# Patient Record
Sex: Female | Born: 1995
Health system: Southern US, Community
[De-identification: ages and names within clinical notes are randomized; demographics above are authoritative.]

## PROBLEM LIST (undated history)

## (undated) DIAGNOSIS — Z789 Other specified health status: Secondary | ICD-10-CM

## (undated) HISTORY — DX: Other specified health status: Z78.9

## (undated) HISTORY — PX: NO PAST SURGERIES: SHX2092

---
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
## 2016-03-21 ENCOUNTER — Ambulatory Visit: Payer: 59

## 2016-03-21 ENCOUNTER — Ambulatory Visit (INDEPENDENT_AMBULATORY_CARE_PROVIDER_SITE_OTHER): Payer: 59 | Admitting: *Deleted

## 2016-03-21 ENCOUNTER — Encounter: Payer: Self-pay | Admitting: *Deleted

## 2016-03-21 DIAGNOSIS — Z3042 Encounter for surveillance of injectable contraceptive: Secondary | ICD-10-CM | POA: Diagnosis not present

## 2016-03-21 DIAGNOSIS — Z3202 Encounter for pregnancy test, result negative: Secondary | ICD-10-CM | POA: Diagnosis not present

## 2016-03-21 DIAGNOSIS — Z308 Encounter for other contraceptive management: Secondary | ICD-10-CM

## 2016-03-21 LAB — POCT URINE PREGNANCY: Preg Test, Ur: NEGATIVE

## 2016-03-21 MED ORDER — MEDROXYPROGESTERONE ACETATE 150 MG/ML IM SUSP
150.0000 mg | Freq: Once | INTRAMUSCULAR | Status: AC
  Administered 2016-03-21: 150 mg via INTRAMUSCULAR

## 2016-03-21 NOTE — Progress Notes (Signed)
Pt here for Depo. Pt tolerated shot well. Return in 12 weeks for next shot. JSY 

## 2016-06-13 ENCOUNTER — Ambulatory Visit: Payer: 59

## 2016-06-25 ENCOUNTER — Other Ambulatory Visit: Payer: 59

## 2016-06-25 ENCOUNTER — Ambulatory Visit: Payer: 59

## 2016-06-25 DIAGNOSIS — Z3042 Encounter for surveillance of injectable contraceptive: Secondary | ICD-10-CM | POA: Diagnosis not present

## 2016-06-26 ENCOUNTER — Other Ambulatory Visit: Payer: Self-pay | Admitting: Advanced Practice Midwife

## 2016-06-26 ENCOUNTER — Ambulatory Visit: Payer: 59

## 2016-06-26 LAB — BETA HCG QUANT (REF LAB): hCG Quant: <1 m[IU]/mL

## 2016-08-21 ENCOUNTER — Ambulatory Visit (INDEPENDENT_AMBULATORY_CARE_PROVIDER_SITE_OTHER): Payer: 59 | Admitting: Obstetrics & Gynecology

## 2016-08-21 ENCOUNTER — Encounter: Payer: Self-pay | Admitting: Obstetrics & Gynecology

## 2016-08-21 VITALS — BP 110/60 | HR 87 | Ht 66.0 in | Wt 187.0 lb

## 2016-08-21 DIAGNOSIS — N920 Excessive and frequent menstruation with regular cycle: Secondary | ICD-10-CM | POA: Diagnosis not present

## 2016-08-21 DIAGNOSIS — N939 Abnormal uterine and vaginal bleeding, unspecified: Secondary | ICD-10-CM

## 2016-08-21 LAB — POCT HEMOGLOBIN: Hemoglobin: 14.1 g/dL (ref 12.2–16.2)

## 2016-08-21 NOTE — Progress Notes (Signed)
      Chief Complaint  Patient presents with  . having heavy periods    recently stopped Depo    Blood pressure 110/60, pulse 87, height 5\' 6"  (1.676 m), weight 187 lb (84.8 kg), last menstrual period 08/13/2016.  21 y.o. G0P0000 Patient's last menstrual period was 08/13/2016 (exact date). The current method of family planning is none.  Outpatient Encounter Prescriptions as of 08/21/2016  Medication Sig  . [DISCONTINUED] medroxyPROGESTERone (DEPO-PROVERA) 150 MG/ML injection INJECT 1 ML INTO THE MUSCLE EVERY 3 MONTHS  . [DISCONTINUED] medroxyPROGESTERone (DEPO-PROVERA) injection 150 mg    No facility-administered encounter medications on file as of 08/21/2016.     Subjective Pt is "coming off Depo" had her last shot 03/2016, this is her first cycle Has been on the depo for 5 years Generally  Has a couple of days of spotting on the depo Wants to come off of all hormaonal birth control  She said her periods before the depo were not bad just regular   Objective   Pertinent ROS No burning with urination, frequency or urgency No nausea, vomiting or diarrhea Nor fever chills or other constitutional symptoms    Labs or studies Hemoglobin 14.1    Impression Diagnoses this Encounter::   ICD-9-CM ICD-10-CM   1. Menorrhagia with regular cycle 626.2 N92.0    coming off of Depo provera  2. Vaginal bleeding 623.8 N93.9 POCT hemoglobin    Established relevant diagnosis(es):   Plan/Recommendations: No orders of the defined types were placed in this encounter.   Labs or Scans Ordered: Orders Placed This Encounter  Procedures  . POCT hemoglobin    Management:: Pt does not want to use megestrol at this point Will wait and see what happens  Follow up Return if symptoms worsen or fail to improve.        Face to face time:  15 minutes  Greater than 50% of the visit time was spent in counseling and coordination of care with the patient.  The summary and outline  of the counseling and care coordination is summarized in the note above.   All questions were answered.  History reviewed. No pertinent past medical history.  History reviewed. No pertinent surgical history.  OB History    Gravida Para Term Preterm AB Living   0 0 0 0 0 0   SAB TAB Ectopic Multiple Live Births   0 0 0 0        No Known Allergies  Social History   Social History  . Marital status: Single    Spouse name: N/A  . Number of children: N/A  . Years of education: N/A   Social History Main Topics  . Smoking status: Former Smoker    Types: Cigarettes  . Smokeless tobacco: Never Used  . Alcohol use No  . Drug use: No  . Sexual activity: Yes    Birth control/ protection: None, Condom   Other Topics Concern  . None   Social History Narrative  . None    Family History  Problem Relation Age of Onset  . Diabetes Paternal Grandmother   . Diabetes Maternal Grandmother

## 2016-10-16 ENCOUNTER — Ambulatory Visit: Payer: 59 | Admitting: Adult Health

## 2016-10-23 ENCOUNTER — Ambulatory Visit: Payer: 59 | Admitting: Adult Health

## 2016-10-23 ENCOUNTER — Encounter (INDEPENDENT_AMBULATORY_CARE_PROVIDER_SITE_OTHER): Payer: Self-pay

## 2016-10-23 ENCOUNTER — Ambulatory Visit (INDEPENDENT_AMBULATORY_CARE_PROVIDER_SITE_OTHER): Payer: 59 | Admitting: Adult Health

## 2016-10-23 ENCOUNTER — Encounter: Payer: Self-pay | Admitting: Adult Health

## 2016-10-23 VITALS — BP 110/58 | HR 90 | Ht 66.0 in | Wt 179.0 lb

## 2016-10-23 DIAGNOSIS — Z3A01 Less than 8 weeks gestation of pregnancy: Secondary | ICD-10-CM

## 2016-10-23 DIAGNOSIS — Z3201 Encounter for pregnancy test, result positive: Secondary | ICD-10-CM

## 2016-10-23 DIAGNOSIS — O3680X0 Pregnancy with inconclusive fetal viability, not applicable or unspecified: Secondary | ICD-10-CM

## 2016-10-23 LAB — POCT URINE PREGNANCY: Preg Test, Ur: POSITIVE — AB

## 2016-10-23 MED ORDER — FLINTSTONES COMPLETE 60 MG PO CHEW
1.0000 | CHEWABLE_TABLET | Freq: Every day | ORAL | Status: DC
Start: 2016-10-23 — End: 2017-06-21

## 2016-10-23 NOTE — Progress Notes (Signed)
Subjective:     Patient ID: Wynell BalloonMalia A Ihde, female   DOB: 1995-08-07, 21 y.o.   MRN: [id number]  HPI Laurena SpiesMalia is a 21 year old black female in for UPT, has missed a period and had 3+HPT, some nausea, she says she did spot brown about 2 weeks ago after BM, none since.   Review of Systems Missed period with 3+HPTs +nausea Spotted brown after BM 2 weeks ago Reviewed past medical,surgical, social and family history. Reviewed medications and allergies.     Objective:   Physical Exam BP (!) 110/58 (BP Location: Right Arm, Patient Position: Sitting, Cuff Size: Normal)   Pulse 90   Ht 5\' 6"  (1.676 m)   Wt 179 lb (81.2 kg)   LMP 09/14/2016 (Approximate)   BMI 28.89 kg/m UPT +, about 5+3 weeks by LMP, with EDD 06/22/17,Skin warm and dry. Neck: mid line trachea, normal thyroid, good ROM, no lymphadenopathy noted. Lungs: clear to ausculation bilaterally. Cardiovascular: regular rate and rhythm.Abdomen is soft and non tender.    Pt aware of after hours call service and [facility]  Assessment:     1. Positive pregnancy test   2. Less than [redacted] weeks gestation of pregnancy   3. Encounter to determine fetal viability of pregnancy, single or unspecified fetus       Plan:    Eat often  Ok to take OTC PNV or flintstones Review handout by [facility]  Return in 2 weeks for dating [place]

## 2016-10-24 ENCOUNTER — Ambulatory Visit: Payer: 59 | Admitting: Women's Health

## 2016-10-26 ENCOUNTER — Telehealth: Payer: Self-pay | Admitting: *Deleted

## 2016-10-26 MED ORDER — PROMETHAZINE HCL 25 MG PO TABS: 12.5000 mg | ORAL_TABLET | Freq: Four times a day (QID) | ORAL | 0 refills | Status: DC | PRN

## 2016-10-26 NOTE — Telephone Encounter (Signed)
Called requesting nausea meds, rx phenergan.  Angelica Ramirez, CNM, [facility] 10/26/2016 2:31 PM

## 2016-10-29 ENCOUNTER — Encounter (HOSPITAL_COMMUNITY): Payer: Self-pay

## 2016-10-29 ENCOUNTER — Telehealth: Payer: Self-pay | Admitting: Women's Health

## 2016-10-29 ENCOUNTER — Emergency Department (HOSPITAL_COMMUNITY)
Admission: EM | Admit: 2016-10-29 | Discharge: 2016-10-29 | Disposition: A | Discharge status: Home / Self Care | Payer: 59 | Attending: Emergency Medicine | Admitting: Emergency Medicine

## 2016-10-29 DIAGNOSIS — Z3A01 Less than 8 weeks gestation of pregnancy: Secondary | ICD-10-CM | POA: Insufficient documentation

## 2016-10-29 DIAGNOSIS — O210 Mild hyperemesis gravidarum: Secondary | ICD-10-CM | POA: Diagnosis not present

## 2016-10-29 DIAGNOSIS — O219 Vomiting of pregnancy, unspecified: Secondary | ICD-10-CM | POA: Insufficient documentation

## 2016-10-29 DIAGNOSIS — R112 Nausea with vomiting, unspecified: Secondary | ICD-10-CM

## 2016-10-29 DIAGNOSIS — R110 Nausea: Secondary | ICD-10-CM | POA: Diagnosis not present

## 2016-10-29 LAB — URINALYSIS, ROUTINE W REFLEX MICROSCOPIC
Bilirubin Urine: NEGATIVE
Glucose, UA: NEGATIVE mg/dL
Hgb urine dipstick: NEGATIVE
Ketones, ur: 80 mg/dL — AB
Nitrite: NEGATIVE
Protein, ur: 100 mg/dL — AB
Specific Gravity, Urine: 1.028 (ref 1.005–1.030)
pH: 5 (ref 5.0–8.0)

## 2016-10-29 LAB — COMPREHENSIVE METABOLIC PANEL
ALT: 10 U/L — ABNORMAL LOW (ref 14–54)
AST: 17 U/L (ref 15–41)
Albumin: 4.3 g/dL (ref 3.5–5.0)
Alkaline Phosphatase: 46 U/L (ref 38–126)
Anion gap: 9 (ref 5–15)
BUN: 7 mg/dL (ref 6–20)
CO2: 25 mmol/L (ref 22–32)
Calcium: 9.5 mg/dL (ref 8.9–10.3)
Chloride: 103 mmol/L (ref 101–111)
Creatinine, Ser: 0.66 mg/dL (ref 0.44–1.00)
GFR calc Af Amer: >60 mL/min (ref >60)
GFR calc non Af Amer: >60 mL/min (ref >60)
Glucose, Bld: 92 mg/dL (ref 65–99)
Potassium: 3.6 mmol/L (ref 3.5–5.1)
Sodium: 137 mmol/L (ref 135–145)
Total Bilirubin: 1.2 mg/dL (ref 0.3–1.2)
Total Protein: 7.8 g/dL (ref 6.5–8.1)

## 2016-10-29 LAB — CBC
HCT: 40.3 % (ref 36.0–46.0)
Hemoglobin: 13.6 g/dL (ref 12.0–15.0)
MCH: 28.4 pg (ref 26.0–34.0)
MCHC: 33.7 g/dL (ref 30.0–36.0)
MCV: 84.1 fL (ref 78.0–100.0)
Platelets: 250 10*3/uL (ref 150–400)
RBC: 4.79 MIL/uL (ref 3.87–5.11)
RDW: 11.8 % (ref 11.5–15.5)
WBC: 12.8 10*3/uL — ABNORMAL HIGH (ref 4.0–10.5)

## 2016-10-29 LAB — LIPASE, BLOOD: Lipase: 28 U/L (ref 11–51)

## 2016-10-29 MED ORDER — ONDANSETRON 4 MG PO TBDP: 4.0000 mg | ORAL_TABLET | Freq: Three times a day (TID) | ORAL | 1 refills | Status: DC | PRN

## 2016-10-29 MED ORDER — ONDANSETRON 4 MG PO TBDP
4.0000 mg | ORAL_TABLET | Freq: Once | ORAL | Status: AC
  Administered 2016-10-29: 4 mg via ORAL

## 2016-10-29 MED ORDER — PROMETHAZINE HCL 25 MG PO TABS: 25.0000 mg | ORAL_TABLET | Freq: Four times a day (QID) | ORAL | 1 refills | Status: DC | PRN

## 2016-10-29 MED ORDER — ONDANSETRON 4 MG PO TBDP
ORAL_TABLET | ORAL | Status: AC
  Filled 2016-10-29: qty 1

## 2016-10-29 MED ORDER — SODIUM CHLORIDE 0.9 % IV BOLUS (SEPSIS): 2000.0000 mL | Freq: Once | INTRAVENOUS | Status: DC

## 2016-10-29 MED ORDER — SODIUM CHLORIDE 0.9 % IV SOLN: INTRAVENOUS | Status: DC

## 2016-10-29 MED ORDER — ONDANSETRON HCL 4 MG/2ML IJ SOLN
4.0000 mg | Freq: Once | INTRAMUSCULAR | Status: DC
  Filled 2016-10-29: qty 2

## 2016-10-29 NOTE — ED Notes (Signed)
MD at the bedside  

## 2016-10-29 NOTE — ED Notes (Signed)
Patient given discharge instruction, verbalized understand. Patient ambulatory out of the department.  Signature pad did not work

## 2016-10-29 NOTE — ED Notes (Signed)
Pt drinking water with out nausea, MD aware

## 2016-10-29 NOTE — ED Notes (Signed)
4 attempts by two RN for IV. Pt does not want another stick, Ok to try ODT and fluids, MD aware and gave verbal order.

## 2016-10-29 NOTE — ED Provider Notes (Signed)
[facility] DEPT Provider Note   CSN: [id number] Arrival date & time: 10/29/16  1203     History   Chief Complaint Chief Complaint  Patient presents with  . Emesis    HPI Angelica Ramirez is a 21 y.o. female.  Patient is gravida 1 para 0 approximate [redacted] weeks pregnant. [facility] by [facility]. Patient's been struggling with nausea and vomiting about 3-4 times a day. No fevers no abdominal pain no pelvic pain no vaginal bleeding.      History reviewed. No pertinent past medical history.  Patient Active Problem List   Diagnosis Date Noted  . Encounter for initial prescription of injectable contraceptive 07/06/2015  . IMMUNIZATION DELAY 01/05/2008  . INFECTIOUS MONONUCLEOSIS 12/08/2007    History reviewed. No pertinent surgical history.  OB History    Gravida Para Term Preterm AB Living   1 0 0 0 0 0   SAB TAB Ectopic Multiple Live Births   0 0 0 0         Home Medications    Prior to Admission medications   Medication Sig Start Date End Date Taking? Authorizing Provider  flintstones complete (FLINTSTONES) 60 MG chewable tablet Chew 1 tablet by mouth daily. 10/23/16  Yes Cyril Mourning A, NP  ondansetron (ZOFRAN ODT) 4 MG disintegrating tablet Take 1 tablet (4 mg total) by mouth every 8 (eight) hours as needed. 10/29/16   Vanetta Mulders, MD  promethazine (PHENERGAN) 25 MG tablet Take 0.5-1 tablets (12.5-25 mg total) by mouth every 6 (six) hours as needed for nausea or vomiting. 10/26/16   Cheral Marker, CNM  promethazine (PHENERGAN) 25 MG tablet Take 1 tablet (25 mg total) by mouth every 6 (six) hours as needed. 10/29/16   Vanetta Mulders, MD    Family History Family History  Problem Relation Age of Onset  . Diabetes Paternal Grandmother   . Diabetes Maternal Grandmother     Social History Social History  Substance Use Topics  . Smoking status: Never Smoker  . Smokeless tobacco: Never Used  . Alcohol use No     Allergies   Patient has  no known allergies.   Review of Systems Review of Systems  Constitutional: Positive for appetite change. Negative for fever.  HENT: Negative for congestion.   Respiratory: Negative for shortness of breath.   Cardiovascular: Negative for chest pain.  Gastrointestinal: Positive for nausea and vomiting. Negative for abdominal pain.  Genitourinary: Negative for pelvic pain and vaginal bleeding.  Musculoskeletal: Negative for back pain.  Skin: Negative for rash.  Neurological: Negative for headaches.  Hematological: Does not bruise/bleed easily.  Psychiatric/Behavioral: Negative for confusion.     Physical Exam Updated Vital Signs BP 126/67   Pulse 60   Temp 98.6 F (37 C) (Oral)   Resp 17   Ht 1.676 m (5\' 6" )   Wt 81.2 kg (179 lb)   LMP 09/14/2016 (Approximate)   SpO2 100%   BMI 28.89 kg/m   Physical Exam  Constitutional: She is oriented to person, place, and time. She appears well-developed and well-nourished. No distress.  HENT:  Head: Normocephalic and atraumatic.  Mouth/Throat: Oropharynx is clear and moist.  Eyes: Pupils are equal, round, and reactive to light. Conjunctivae and EOM are normal.  Neck: Normal range of motion.  Cardiovascular: Normal rate, regular rhythm and normal heart sounds.   Pulmonary/Chest: Effort normal and breath sounds normal.  Abdominal: Soft. Bowel sounds are normal. There is no tenderness.  Musculoskeletal: Normal range of motion.  She exhibits no edema.  Neurological: She is alert and oriented to person, place, and time. No cranial nerve deficit or sensory deficit. She exhibits normal muscle tone. Coordination normal.  Skin: Skin is warm.  Nursing note and vitals reviewed.    ED Treatments / Results  Labs (all labs ordered are listed, but only abnormal results are displayed) Labs Reviewed  COMPREHENSIVE METABOLIC PANEL - Abnormal; Notable for the following:       Result Value   ALT 10 (*)    All other components within normal limits   CBC - Abnormal; Notable for the following:    WBC 12.8 (*)    All other components within normal limits  URINALYSIS, ROUTINE W REFLEX MICROSCOPIC - Abnormal; Notable for the following:    APPearance HAZY (*)    Ketones, ur 80 (*)    Protein, ur 100 (*)    Leukocytes, UA TRACE (*)    Bacteria, UA RARE (*)    Squamous Epithelial / LPF 6-30 (*)    All other components within normal limits  LIPASE, BLOOD    EKG  EKG Interpretation None       Radiology No results found.  Procedures Procedures (including critical care time)  Medications Ordered in ED Medications  ondansetron (ZOFRAN-ODT) 4 MG disintegrating tablet (not administered)  ondansetron (ZOFRAN-ODT) disintegrating tablet 4 mg (4 mg Oral Given 10/29/16 1633)     Initial Impression / Assessment and Plan / ED Course  I have reviewed the triage vital signs and the nursing notes.  Pertinent labs & imaging results that were available during my care of the patient were reviewed by me and considered in my medical decision making (see chart for details).     Patient is about [redacted] weeks pregnant. Followed by [facility]. Patient with difficulty with nausea and vomiting vomiting about 3-4 times a day. Patient did have some prescription for Phenergan phoned in by [facility] patient unaware of this. Here patient the felt much better with Zofran. Had initially intended IV but patient didn't want IV. Bit of a difficult stick. But patient was well-hydrated. Patient was given prescriptions for Phenergan and Zofran. [facility] with [facility].    Final Clinical Impressions(s) / ED Diagnoses   Final diagnoses:  Non-intractable vomiting with nausea, unspecified vomiting type  Less than [redacted] weeks gestation of pregnancy    New Prescriptions New Prescriptions   ONDANSETRON (ZOFRAN ODT) 4 MG DISINTEGRATING TABLET    Take 1 tablet (4 mg total) by mouth every 8 (eight) hours as needed.   PROMETHAZINE (PHENERGAN) 25 MG TABLET     Take 1 tablet (25 mg total) by mouth every 6 (six) hours as needed.     Vanetta MuldersZackowski, Ariane Ditullio, MD 10/29/16 [phone]

## 2016-10-29 NOTE — Discharge Instructions (Signed)
Follow-up with [facility]. Take the Zofran or the Phenergan on a regular basis with nausea and vomiting. Also can take both.

## 2016-10-29 NOTE — Telephone Encounter (Signed)
LMOVM returning call 

## 2016-10-29 NOTE — ED Triage Notes (Signed)
Reports of nausea and vomiting x1 week. Currently [redacted] weeks pregnant. Denies pain.

## 2016-10-30 ENCOUNTER — Telehealth: Payer: Self-pay | Admitting: *Deleted

## 2016-10-30 NOTE — Telephone Encounter (Signed)
Patient states she went to ER because the nausea was so bad and was prescribed something there. Encouraged patient to mainly stay hydrated, small sips of fluid, try lemon heads and saltines. Verbalized understanding.
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
## 2016-11-06 ENCOUNTER — Ambulatory Visit (INDEPENDENT_AMBULATORY_CARE_PROVIDER_SITE_OTHER): Payer: 59

## 2016-11-06 ENCOUNTER — Other Ambulatory Visit: Payer: 59

## 2016-11-06 DIAGNOSIS — O3680X0 Pregnancy with inconclusive fetal viability, not applicable or unspecified: Secondary | ICD-10-CM

## 2016-11-06 NOTE — Progress Notes (Signed)
US 7+4 wks,single IUP w/ys,positive fht 129 bpm,normal right ovary,simple left corpus luteal cyst left ovary 2.9 x 2.5 x 1.7 cm,crl 10.4 mm,EDD 06/21/2017

## 2016-11-22 ENCOUNTER — Ambulatory Visit (INDEPENDENT_AMBULATORY_CARE_PROVIDER_SITE_OTHER): Payer: 59 | Admitting: Advanced Practice Midwife

## 2016-11-22 ENCOUNTER — Encounter: Payer: Self-pay | Admitting: Advanced Practice Midwife

## 2016-11-22 VITALS — BP 122/60 | HR 102 | Wt 172.0 lb

## 2016-11-22 DIAGNOSIS — Z3A09 9 weeks gestation of pregnancy: Secondary | ICD-10-CM | POA: Diagnosis not present

## 2016-11-22 DIAGNOSIS — Z3401 Encounter for supervision of normal first pregnancy, first trimester: Secondary | ICD-10-CM | POA: Diagnosis not present

## 2016-11-22 DIAGNOSIS — Z1389 Encounter for screening for other disorder: Secondary | ICD-10-CM

## 2016-11-22 DIAGNOSIS — Z3682 Encounter for antenatal screening for nuchal translucency: Secondary | ICD-10-CM

## 2016-11-22 DIAGNOSIS — Z3400 Encounter for supervision of normal first pregnancy, unspecified trimester: Secondary | ICD-10-CM | POA: Insufficient documentation

## 2016-11-22 DIAGNOSIS — Z331 Pregnant state, incidental: Secondary | ICD-10-CM

## 2016-11-22 LAB — POCT URINALYSIS DIPSTICK
Blood, UA: NEGATIVE
Glucose, UA: NEGATIVE
Ketones, UA: NEGATIVE
Leukocytes, UA: NEGATIVE
Nitrite, UA: NEGATIVE
Protein, UA: NEGATIVE

## 2016-11-22 NOTE — Progress Notes (Signed)
  Subjective:    Angelica Ramirez is a G1P0000 5163w6d being seen today for her first obstetrical visit.  Her obstetrical history is significant for first pregnancy.  Pregnancy history fully reviewed.  Patient reports no complaints.  Vitals:   11/22/16 1009  BP: 122/60  Pulse: (!) 102  Weight: 172 lb (78 kg)    HISTORY: OB History  Gravida Para Term Preterm AB Living  1 0 0 0 0 0  SAB TAB Ectopic Multiple Live Births  0 0 0 0      # Outcome Date GA Lbr Len/2nd Weight Sex Delivery Anes PTL Lv  1 Current              Past Medical History:  Diagnosis Date  . Medical history non-contributory    Past Surgical History:  Procedure Laterality Date  . NO PAST SURGERIES     Family History  Problem Relation Age of Onset  . Diabetes Paternal Grandmother   . Diabetes Maternal Grandmother      Exam                                      System:     Skin: normal coloration and turgor, no rashes    Neurologic: oriented, normal, normal mood   Extremities: normal strength, tone, and muscle mass   HEENT PERRLA   Mouth/Teeth mucous membranes moist, normal dentition   Neck supple and no masses   Cardiovascular: regular rate and rhythm   Respiratory:  appears well, vitals normal, no respiratory distress, acyanotic   Abdomen: soft, non-tender;  FHR: 160us          Assessment:    Pregnancy: G1P0000 Patient Active Problem List   Diagnosis Date Noted  . Supervision of normal first pregnancy 11/22/2016  . IMMUNIZATION DELAY 01/05/2008  . INFECTIOUS MONONUCLEOSIS 12/08/2007        Plan:     Initial labs drawn. Continue prenatal vitamins  Problem list reviewed and updated  Reviewed n/v relief measures and warning s/s to report  Reviewed recommended weight gain based on pre-gravid BMI  Encouraged well-balanced diet Genetic Screening discussed Integrated Screen: requested.  Ultrasound discussed; fetal survey: requested.  Return in about 2 weeks (around 12/06/2016)  for US:NT+1st IT, LROB.  CRESENZO-DISHMAN,Areliz Rothman 11/22/2016

## 2016-11-22 NOTE — Patient Instructions (Signed)
 First Trimester of Pregnancy The first trimester of pregnancy is from week 1 until the end of week 12 (months 1 through 3). A week after a sperm fertilizes an egg, the egg will implant on the wall of the uterus. This embryo will begin to develop into a baby. Genes from you and your partner are forming the baby. The female genes determine whether the baby is a boy or a girl. At 6-8 weeks, the eyes and face are formed, and the heartbeat can be seen on ultrasound. At the end of 12 weeks, all the baby's organs are formed.  Now that you are pregnant, you will want to do everything you can to have a healthy baby. Two of the most important things are to get good prenatal care and to follow your health care provider's instructions. Prenatal care is all the medical care you receive before the baby's birth. This care will help prevent, find, and treat any problems during the pregnancy and childbirth. BODY CHANGES Your body goes through many changes during pregnancy. The changes vary from woman to woman.   You may gain or lose a couple of pounds at first.  You may feel sick to your stomach (nauseous) and throw up (vomit). If the vomiting is uncontrollable, call your health care provider.  You may tire easily.  You may develop headaches that can be relieved by medicines approved by your health care provider.  You may urinate more often. Painful urination may mean you have a bladder infection.  You may develop heartburn as a result of your pregnancy.  You may develop constipation because certain hormones are causing the muscles that push waste through your intestines to slow down.  You may develop hemorrhoids or swollen, bulging veins (varicose veins).  Your breasts may begin to grow larger and become tender. Your nipples may stick out more, and the tissue that surrounds them (areola) may become darker.  Your gums may bleed and may be sensitive to brushing and flossing.  Dark spots or blotches  (chloasma, mask of pregnancy) may develop on your face. This will likely fade after the baby is born.  Your menstrual periods will stop.  You may have a loss of appetite.  You may develop cravings for certain kinds of food.  You may have changes in your emotions from day to day, such as being excited to be pregnant or being concerned that something may go wrong with the pregnancy and baby.  You may have more vivid and strange dreams.  You may have changes in your hair. These can include thickening of your hair, rapid growth, and changes in texture. Some women also have hair loss during or after pregnancy, or hair that feels dry or thin. Your hair will most likely return to normal after your baby is born. WHAT TO EXPECT AT YOUR PRENATAL VISITS During a routine prenatal visit:  You will be weighed to make sure you and the baby are growing normally.  Your blood pressure will be taken.  Your abdomen will be measured to track your baby's growth.  The fetal heartbeat will be listened to starting around week 10 or 12 of your pregnancy.  Test results from any previous visits will be discussed. Your health care provider may ask you:  How you are feeling.  If you are feeling the baby move.  If you have had any abnormal symptoms, such as leaking fluid, bleeding, severe headaches, or abdominal cramping.  If you have any questions. Other   tests that may be performed during your first trimester include:  Blood tests to find your blood type and to check for the presence of any previous infections. They will also be used to check for low iron levels (anemia) and Rh antibodies. Later in the pregnancy, blood tests for diabetes will be done along with other tests if problems develop.  Urine tests to check for infections, diabetes, or protein in the urine.  An ultrasound to confirm the proper growth and development of the baby.  An amniocentesis to check for possible genetic problems.  Fetal  screens for spina bifida and Down syndrome.  You may need other tests to make sure you and the baby are doing well. HOME CARE INSTRUCTIONS  Medicines  Follow your health care provider's instructions regarding medicine use. Specific medicines may be either safe or unsafe to take during pregnancy.  Take your prenatal vitamins as directed.  If you develop constipation, try taking a stool softener if your health care provider approves. Diet  Eat regular, well-balanced meals. Choose a variety of foods, such as meat or vegetable-based protein, fish, milk and low-fat dairy products, vegetables, fruits, and whole grain breads and cereals. Your health care provider will help you determine the amount of weight gain that is right for you.  Avoid raw meat and uncooked cheese. These carry germs that can cause birth defects in the baby.  Eating four or five small meals rather than three large meals a day may help relieve nausea and vomiting. If you start to feel nauseous, eating a few soda crackers can be helpful. Drinking liquids between meals instead of during meals also seems to help nausea and vomiting.  If you develop constipation, eat more high-fiber foods, such as fresh vegetables or fruit and whole grains. Drink enough fluids to keep your urine clear or pale yellow. Activity and Exercise  Exercise only as directed by your health care provider. Exercising will help you:  Control your weight.  Stay in shape.  Be prepared for labor and delivery.  Experiencing pain or cramping in the lower abdomen or low back is a good sign that you should stop exercising. Check with your health care provider before continuing normal exercises.  Try to avoid standing for long periods of time. Move your legs often if you must stand in one place for a long time.  Avoid heavy lifting.  Wear low-heeled shoes, and practice good posture.  You may continue to have sex unless your health care provider directs you  otherwise. Relief of Pain or Discomfort  Wear a good support bra for breast tenderness.   Take warm sitz baths to soothe any pain or discomfort caused by hemorrhoids. Use hemorrhoid cream if your health care provider approves.   Rest with your legs elevated if you have leg cramps or low back pain.  If you develop varicose veins in your legs, wear support hose. Elevate your feet for 15 minutes, 3-4 times a day. Limit salt in your diet. Prenatal Care  Schedule your prenatal visits by the twelfth week of pregnancy. They are usually scheduled monthly at first, then more often in the last 2 months before delivery.  Write down your questions. Take them to your prenatal visits.  Keep all your prenatal visits as directed by your health care provider. Safety  Wear your seat belt at all times when driving.  Make a list of emergency phone numbers, including numbers for family, friends, the hospital, and police and fire departments. General   Tips  Ask your health care provider for a referral to a local prenatal education class. Begin classes no later than at the beginning of month 6 of your pregnancy.  Ask for help if you have counseling or nutritional needs during pregnancy. Your health care provider can offer advice or refer you to specialists for help with various needs.  Do not use hot tubs, steam rooms, or saunas.  Do not douche or use tampons or scented sanitary pads.  Do not cross your legs for long periods of time.  Avoid cat litter boxes and soil used by cats. These carry germs that can cause birth defects in the baby and possibly loss of the fetus by miscarriage or stillbirth.  Avoid all smoking, herbs, alcohol, and medicines not prescribed by your health care provider. Chemicals in these affect the formation and growth of the baby.  Schedule a dentist appointment. At home, brush your teeth with a soft toothbrush and be gentle when you floss. SEEK MEDICAL CARE IF:   You have  dizziness.  You have mild pelvic cramps, pelvic pressure, or nagging pain in the abdominal area.  You have persistent nausea, vomiting, or diarrhea.  You have a bad smelling vaginal discharge.  You have pain with urination.  You notice increased swelling in your face, hands, legs, or ankles. SEEK IMMEDIATE MEDICAL CARE IF:   You have a fever.  You are leaking fluid from your vagina.  You have spotting or bleeding from your vagina.  You have severe abdominal cramping or pain.  You have rapid weight gain or loss.  You vomit blood or material that looks like coffee grounds.  You are exposed to German measles and have never had them.  You are exposed to fifth disease or chickenpox.  You develop a severe headache.  You have shortness of breath.  You have any kind of trauma, such as from a fall or a car accident. Document Released: 03/27/2001 Document Revised: 08/17/2013 Document Reviewed: 02/10/2013 [employer] Patient Information 2015 [employer]. This information is not intended to replace advice given to you by your health care provider. Make sure you discuss any questions you have with your health care provider.   Nausea & Vomiting  Have saltine crackers or pretzels by your bed and eat a few bites before you raise your head out of bed in the morning  Eat small frequent meals throughout the day instead of large meals  Drink plenty of fluids throughout the day to stay hydrated, just don't drink a lot of fluids with your meals.  This can make your stomach fill up faster making you feel sick  Do not brush your teeth right after you eat  Products with real ginger are good for nausea, like ginger ale and ginger hard candy Make sure it says made with real ginger!  Sucking on sour candy like lemon heads is also good for nausea  If your prenatal vitamins make you nauseated, take them at night so you will sleep through the nausea  Sea Bands  If you feel like you need  medicine for the nausea & vomiting please let us know  If you are unable to keep any fluids or food down please let us know   Constipation  Drink plenty of fluid, preferably water, throughout the day  Eat foods high in fiber such as fruits, vegetables, and grains  Exercise, such as walking, is a good way to keep your bowels regular  Drink warm fluids, especially warm   prune juice, or decaf coffee  Eat a 1/2 cup of real oatmeal (not instant), 1/2 cup applesauce, and 1/2-1 cup warm prune juice every day  If needed, you may take Colace (docusate sodium) stool softener once or twice a day to help keep the stool soft. If you are pregnant, wait until you are out of your first trimester (12-14 weeks of pregnancy)  If you still are having problems with constipation, you may take Miralax once daily as needed to help keep your bowels regular.  If you are pregnant, wait until you are out of your first trimester (12-14 weeks of pregnancy)  Safe Medications in Pregnancy   Acne: Benzoyl Peroxide Salicylic Acid  Backache/Headache: Tylenol: 2 regular strength every 4 hours OR              2 Extra strength every 6 hours  Colds/Coughs/Allergies: Benadryl (alcohol free) 25 mg every 6 hours as needed Breath right strips Claritin Cepacol throat lozenges Chloraseptic throat spray Cold-Eeze- up to three times per day Cough drops, alcohol free Flonase (by prescription only) Guaifenesin Mucinex Robitussin DM (plain only, alcohol free) Saline nasal spray/drops Sudafed (pseudoephedrine) & Actifed ** use only after [redacted] weeks gestation and if you do not have high blood pressure Tylenol Vicks Vaporub Zinc lozenges Zyrtec   Constipation: Colace Ducolax suppositories Fleet enema Glycerin suppositories Metamucil Milk of magnesia Miralax Senokot Smooth move tea  Diarrhea: Kaopectate Imodium A-D  *NO pepto Bismol  Hemorrhoids: Anusol Anusol HC Preparation  H Tucks  Indigestion: Tums Maalox Mylanta Zantac  Pepcid  Insomnia: Benadryl (alcohol free) 25mg every 6 hours as needed Tylenol PM Unisom, no Gelcaps  Leg Cramps: Tums MagGel  Nausea/Vomiting:  Bonine Dramamine Emetrol Ginger extract Sea bands Meclizine  Nausea medication to take during pregnancy:  Unisom (doxylamine succinate 25 mg tablets) Take one tablet daily at bedtime. If symptoms are not adequately controlled, the dose can be increased to a maximum recommended dose of two tablets daily (1/2 tablet in the morning, 1/2 tablet mid-afternoon and one at bedtime). Vitamin B6 100mg tablets. Take one tablet twice a day (up to 200 mg per day).  Skin Rashes: Aveeno products Benadryl cream or 25mg every 6 hours as needed Calamine Lotion 1% cortisone cream  Yeast infection: Gyne-lotrimin 7 Monistat 7   **If taking multiple medications, please check labels to avoid duplicating the same active ingredients **take medication as directed on the label ** Do not exceed 4000 mg of tylenol in 24 hours **Do not take medications that contain aspirin or ibuprofen      

## 2016-11-23 LAB — ABO/RH: Rh Factor: POSITIVE

## 2016-11-23 LAB — CBC
Hematocrit: 38.7 % (ref 34.0–46.6)
Hemoglobin: 12.5 g/dL (ref 11.1–15.9)
MCH: 27.7 pg (ref 26.6–33.0)
MCHC: 32.3 g/dL (ref 31.5–35.7)
MCV: 86 fL (ref 79–97)
Platelets: 229 10*3/uL (ref 150–379)
RBC: 4.51 x10E6/uL (ref 3.77–5.28)
RDW: 13.4 % (ref 12.3–15.4)
WBC: 9.6 10*3/uL (ref 3.4–10.8)

## 2016-11-23 LAB — MICROSCOPIC EXAMINATION: Casts: NONE SEEN /lpf

## 2016-11-23 LAB — URINALYSIS, ROUTINE W REFLEX MICROSCOPIC
Bilirubin, UA: NEGATIVE
Ketones, UA: NEGATIVE
Nitrite, UA: NEGATIVE
RBC, UA: NEGATIVE
Specific Gravity, UA: 1.027 (ref 1.005–1.030)
Urobilinogen, Ur: 1 mg/dL (ref 0.2–1.0)
pH, UA: 6.5 (ref 5.0–7.5)

## 2016-11-23 LAB — ANTIBODY SCREEN: Antibody Screen: NEGATIVE

## 2016-11-23 LAB — RPR: RPR Ser Ql: NONREACTIVE

## 2016-11-23 LAB — HIV ANTIBODY (ROUTINE TESTING W REFLEX): HIV Screen 4th Generation wRfx: NONREACTIVE

## 2016-11-23 LAB — RUBELLA SCREEN: Rubella Antibodies, IGG: 1.17 index (ref >0.99)

## 2016-11-23 LAB — HEPATITIS B SURFACE ANTIGEN: Hepatitis B Surface Ag: NEGATIVE

## 2016-11-23 LAB — VARICELLA ZOSTER ANTIBODY, IGG: Varicella zoster IgG: 265 index (ref >165)

## 2016-11-23 LAB — SICKLE CELL SCREEN: Sickle Cell Screen: NEGATIVE

## 2016-11-24 LAB — URINE CULTURE: Organism ID, Bacteria: NO GROWTH

## 2016-11-27 LAB — GC/CHLAMYDIA PROBE AMP
Chlamydia trachomatis, NAA: NEGATIVE
Neisseria gonorrhoeae by PCR: NEGATIVE

## 2016-12-06 ENCOUNTER — Ambulatory Visit (INDEPENDENT_AMBULATORY_CARE_PROVIDER_SITE_OTHER): Payer: 59

## 2016-12-06 ENCOUNTER — Ambulatory Visit (INDEPENDENT_AMBULATORY_CARE_PROVIDER_SITE_OTHER): Payer: 59 | Admitting: Women's Health

## 2016-12-06 ENCOUNTER — Encounter: Payer: Self-pay | Admitting: Women's Health

## 2016-12-06 VITALS — BP 104/68 | HR 88 | Wt 172.0 lb

## 2016-12-06 DIAGNOSIS — Z3682 Encounter for antenatal screening for nuchal translucency: Secondary | ICD-10-CM | POA: Diagnosis not present

## 2016-12-06 DIAGNOSIS — Z3401 Encounter for supervision of normal first pregnancy, first trimester: Secondary | ICD-10-CM | POA: Diagnosis not present

## 2016-12-06 DIAGNOSIS — Z3A11 11 weeks gestation of pregnancy: Secondary | ICD-10-CM

## 2016-12-06 DIAGNOSIS — Z331 Pregnant state, incidental: Secondary | ICD-10-CM

## 2016-12-06 DIAGNOSIS — Z1389 Encounter for screening for other disorder: Secondary | ICD-10-CM

## 2016-12-06 LAB — POCT URINALYSIS DIPSTICK
Blood, UA: NEGATIVE
Ketones, UA: NEGATIVE
Leukocytes, UA: NEGATIVE
Nitrite, UA: NEGATIVE
Protein, UA: NEGATIVE

## 2016-12-06 NOTE — Progress Notes (Signed)
Low-risk OB appointment G1P0000 [redacted]w[redacted]d Estimated Date of Delivery: 06/21/17 BP 104/68   Pulse 88   Wt 172 lb (78 kg)   LMP 09/14/2016 (Approximate)   BMI 27.76 kg/m   BP, weight, and urine reviewed.  Refer to obstetrical flow sheet for FH & FHR.  No fm yet. Denies cramping, lof, vb, or uti s/s. No complaints. Reviewed today's normal nt u/s, warning s/s to report. Plan:  Continue routine obstetrical care  F/U in 4wks for OB appointment and 2nd IT  UDS today, wasn't resulted at new ob

## 2016-12-06 NOTE — Patient Instructions (Signed)

## 2016-12-06 NOTE — Progress Notes (Signed)
[place] 11+6 wks,measurements c/w dates,normal right ovary,simple left corpus luteal cyst 2.8 x 3.3 x 1.7 cm,fhr 157 bpm,post pl gr 0,NB present,NT 1 mm

## 2016-12-07 ENCOUNTER — Encounter: Payer: Self-pay | Admitting: Women's Health

## 2016-12-07 DIAGNOSIS — F1290 Cannabis use, unspecified, uncomplicated: Secondary | ICD-10-CM | POA: Insufficient documentation

## 2016-12-07 LAB — PMP SCREEN PROFILE (10S), URINE
Amphetamine Scrn, Ur: NEGATIVE ng/mL
BARBITURATE SCREEN URINE: NEGATIVE ng/mL
BENZODIAZEPINE SCREEN, URINE: NEGATIVE ng/mL
CANNABINOIDS UR QL SCN: POSITIVE ng/mL — AB
Cocaine (Metab) Scrn, Ur: NEGATIVE ng/mL
Creatinine(Crt), U: 125.2 mg/dL (ref 20.0–300.0)
Methadone Screen, Urine: NEGATIVE ng/mL
OXYCODONE+OXYMORPHONE UR QL SCN: NEGATIVE ng/mL
Opiate Scrn, Ur: NEGATIVE ng/mL
Ph of Urine: 7.5 (ref 4.5–8.9)
Phencyclidine Qn, Ur: NEGATIVE ng/mL
Propoxyphene Scrn, Ur: NEGATIVE ng/mL

## 2016-12-07 LAB — MED LIST OPTION NOT SELECTED

## 2016-12-12 LAB — MATERNAL SCREEN, INTEGRATED #1
Crown Rump Length: 50.8 mm
Gest. Age on Collection Date: 11.9 weeks
Maternal Age at EDD: 20.5 yr
Nuchal Translucency (NT): 1 mm
Number of Fetuses: 1
PAPP-A Value: 1526.1 ng/mL
Weight: 172 [lb_av]

## 2016-12-25 ENCOUNTER — Telehealth: Payer: Self-pay | Admitting: *Deleted

## 2016-12-25 NOTE — Telephone Encounter (Signed)
Can be seen for a non urgent visit since not cramping.

## 2016-12-25 NOTE — Telephone Encounter (Signed)
Informed patient that since she is not cramping she could be seen for non urgent visit. Patient is already scheduled for visit tomorrow. Still no cramping currently.

## 2016-12-25 NOTE — Telephone Encounter (Signed)
Patient called with complaints of light brown blood last night and light pink this morning mainly when she wipes after using the bathroom.  States she is having to wear a panty liner but is not saturating it. No cramping and no recent intercourse.

## 2016-12-26 ENCOUNTER — Ambulatory Visit (INDEPENDENT_AMBULATORY_CARE_PROVIDER_SITE_OTHER): Payer: 59 | Admitting: Adult Health

## 2016-12-26 ENCOUNTER — Encounter: Payer: Self-pay | Admitting: Adult Health

## 2016-12-26 VITALS — BP 118/60 | HR 91 | Wt 175.0 lb

## 2016-12-26 DIAGNOSIS — N760 Acute vaginitis: Secondary | ICD-10-CM | POA: Diagnosis not present

## 2016-12-26 DIAGNOSIS — N898 Other specified noninflammatory disorders of vagina: Secondary | ICD-10-CM | POA: Diagnosis not present

## 2016-12-26 DIAGNOSIS — O26892 Other specified pregnancy related conditions, second trimester: Secondary | ICD-10-CM

## 2016-12-26 DIAGNOSIS — Z1389 Encounter for screening for other disorder: Secondary | ICD-10-CM

## 2016-12-26 DIAGNOSIS — Z331 Pregnant state, incidental: Secondary | ICD-10-CM

## 2016-12-26 DIAGNOSIS — Z3A14 14 weeks gestation of pregnancy: Secondary | ICD-10-CM

## 2016-12-26 DIAGNOSIS — B9689 Other specified bacterial agents as the cause of diseases classified elsewhere: Secondary | ICD-10-CM

## 2016-12-26 LAB — POCT URINALYSIS DIPSTICK
Blood, UA: NEGATIVE
Glucose, UA: NEGATIVE
Ketones, UA: NEGATIVE
Leukocytes, UA: NEGATIVE
Nitrite, UA: NEGATIVE
Protein, UA: NEGATIVE

## 2016-12-26 LAB — POCT WET PREP (WET MOUNT)
Clue Cells Wet Prep Whiff POC: POSITIVE
WBC, Wet Prep HPF POC: POSITIVE

## 2016-12-26 MED ORDER — METRONIDAZOLE 500 MG PO TABS: 500.0000 mg | ORAL_TABLET | Freq: Two times a day (BID) | ORAL | 0 refills | Status: DC

## 2016-12-26 NOTE — Patient Instructions (Signed)
Bacterial Vaginosis Bacterial vaginosis is a vaginal infection that occurs when the normal balance of bacteria in the vagina is disrupted. It results from an overgrowth of certain bacteria. This is the most common vaginal infection among women ages 15-44. Because bacterial vaginosis increases your risk for STIs (sexually transmitted infections), getting treated can help reduce your risk for chlamydia, gonorrhea, herpes, and HIV (human immunodeficiency virus). Treatment is also important for preventing complications in pregnant women, because this condition can cause an early (premature) delivery. What are the causes? This condition is caused by an increase in harmful bacteria that are normally present in small amounts in the vagina. However, the reason that the condition develops is not fully understood. What increases the risk? The following factors may make you more likely to develop this condition:  Having a new sexual partner or multiple sexual partners.  Having unprotected sex.  Douching.  Having an intrauterine device (IUD).  Smoking.  Drug and alcohol abuse.  Taking certain antibiotic medicines.  Being pregnant.  You cannot get bacterial vaginosis from toilet seats, bedding, swimming pools, or contact with objects around you. What are the signs or symptoms? Symptoms of this condition include:  Grey or white vaginal discharge. The discharge can also be watery or foamy.  A fish-like odor with discharge, especially after sexual intercourse or during menstruation.  Itching in and around the vagina.  Burning or pain with urination.  Some women with bacterial vaginosis have no signs or symptoms. How is this diagnosed? This condition is diagnosed based on:  Your medical history.  A physical exam of the vagina.  Testing a sample of vaginal fluid under a microscope to look for a large amount of bad bacteria or abnormal cells. Your health care provider may use a cotton swab  or a small wooden spatula to collect the sample.  How is this treated? This condition is treated with antibiotics. These may be given as a pill, a vaginal cream, or a medicine that is put into the vagina (suppository). If the condition comes back after treatment, a second round of antibiotics may be needed. Follow these instructions at home: Medicines  Take over-the-counter and prescription medicines only as told by your health care provider.  Take or use your antibiotic as told by your health care provider. Do not stop taking or using the antibiotic even if you start to feel better. General instructions  If you have a female sexual partner, tell her that you have a vaginal infection. She should see her health care provider and be treated if she has symptoms. If you have a female sexual partner, he does not need treatment.  During treatment: ? Avoid sexual activity until you finish treatment. ? Do not douche. ? Avoid alcohol as directed by your health care provider. ? Avoid breastfeeding as directed by your health care provider.  Drink enough water and fluids to keep your urine clear or pale yellow.  Keep the area around your vagina and rectum clean. ? Wash the area daily with warm water. ? Wipe yourself from front to back after using the toilet.  Keep all follow-up visits as told by your health care provider. This is important. How is this prevented?  Do not douche.  Wash the outside of your vagina with warm water only.  Use protection when having sex. This includes latex condoms and dental dams.  Limit how many sexual partners you have. To help prevent bacterial vaginosis, it is best to have sex with just   one partner (monogamous).  Make sure you and your sexual partner are tested for STIs.  Wear cotton or cotton-lined underwear.  Avoid wearing tight pants and pantyhose, especially during summer.  Limit the amount of alcohol that you drink.  Do not use any products that  contain nicotine or tobacco, such as cigarettes and e-cigarettes. If you need help quitting, ask your health care provider.  Do not use illegal drugs. Where to find more information:  Centers for Disease Control and Prevention: www.cdc.gov/std  American Sexual Health Association (ASHA): www.ashastd.org  U.S. Department of Health and Human Services, Office on Women's Health: www.womenshealth.gov/ or https://www.womenshealth.gov/a-z-topics/bacterial-vaginosis Contact a health care provider if:  Your symptoms do not improve, even after treatment.  You have more discharge or pain when urinating.  You have a fever.  You have pain in your abdomen.  You have pain during sex.  You have vaginal bleeding between periods. Summary  Bacterial vaginosis is a vaginal infection that occurs when the normal balance of bacteria in the vagina is disrupted.  Because bacterial vaginosis increases your risk for STIs (sexually transmitted infections), getting treated can help reduce your risk for chlamydia, gonorrhea, herpes, and HIV (human immunodeficiency virus). Treatment is also important for preventing complications in pregnant women, because the condition can cause an early (premature) delivery.  This condition is treated with antibiotic medicines. These may be given as a pill, a vaginal cream, or a medicine that is put into the vagina (suppository). This information is not intended to replace advice given to you by your health care provider. Make sure you discuss any questions you have with your health care provider. Document Released: 04/02/2005 Document Revised: 12/17/2015 Document Reviewed: 12/17/2015 Elsevier Interactive Patient Education  2017 [employer]  

## 2016-12-26 NOTE — Progress Notes (Signed)
G1P0000 731w5d Estimated Date of Delivery: 06/21/17  Blood pressure 118/60, pulse 91, weight 175 lb (79.4 kg), last menstrual period 09/14/2016.   BP weight and urine results all reviewed and noted.  Please refer to the obstetrical flow sheet for the fundal height and fetal heart rate documentation:  Patient  denies any bleeding and no rupture of membranes symptoms or regular contractions. Patient is complaining of yellowish vaginal discharge with odor, then had spotting Monday, no spotting now. Skin warm and dry.Pelvic: external genitalia is normal in appearance no lesions, vagina: white creamy discharge with odor,urethra has no lesions or masses noted, cervix:smooth, uterus: about 15 week size non tender, no masses felt, adnexa: no masses or tenderness noted. Bladder is non tender and no masses felt. Wet prep: + for clue cells and +WBCs. Will treat for BV with flagyl. All questions were answered.  Orders Placed This Encounter  Procedures  . POCT Urinalysis Dipstick    Plan:  Continued routine obstetrical care,  Take flagyl 500 mg 1 bid x 7 days, no sex or alcohol, review handout on BV Follow up next week as scheduled

## 2017-01-03 ENCOUNTER — Ambulatory Visit (INDEPENDENT_AMBULATORY_CARE_PROVIDER_SITE_OTHER): Payer: 59 | Admitting: Obstetrics and Gynecology

## 2017-01-03 ENCOUNTER — Encounter: Payer: Self-pay | Admitting: Obstetrics and Gynecology

## 2017-01-03 VITALS — BP 100/62 | HR 102 | Wt 173.6 lb

## 2017-01-03 DIAGNOSIS — Z331 Pregnant state, incidental: Secondary | ICD-10-CM

## 2017-01-03 DIAGNOSIS — Z1379 Encounter for other screening for genetic and chromosomal anomalies: Secondary | ICD-10-CM | POA: Diagnosis not present

## 2017-01-03 DIAGNOSIS — Z3402 Encounter for supervision of normal first pregnancy, second trimester: Secondary | ICD-10-CM

## 2017-01-03 DIAGNOSIS — Z3A15 15 weeks gestation of pregnancy: Secondary | ICD-10-CM

## 2017-01-03 DIAGNOSIS — Z1389 Encounter for screening for other disorder: Secondary | ICD-10-CM

## 2017-01-03 LAB — POCT URINALYSIS DIPSTICK
Blood, UA: NEGATIVE
Glucose, UA: NEGATIVE
Ketones, UA: NEGATIVE
Nitrite, UA: NEGATIVE
Protein, UA: NEGATIVE

## 2017-01-03 NOTE — Progress Notes (Signed)
Patient ID: Angelica Ramirez, female   DOB: 04-05-1996, 21 y.o.   MRN: [id number]  G1P0000  Estimated Date of Delivery: 06/21/17 LROB [redacted]w[redacted]d  Chief Complaint  Patient presents with  . Routine Prenatal Visit    2nd IT  ____  Patient complaints: No complaints. Patient reports good fetal movement, denies any bleeding, rupture of membranes,or regular contractions.  Blood pressure 100/62, pulse (!) 102, weight 173 lb 9.6 oz (78.7 kg), last menstrual period 09/14/2016.   Urine results:notable for 2+ leukocytes otherwise n refer to the ob flow sheet for FH and FHR, ,                          Physical Examination: General appearance - alert, well appearing, and in no distress and oriented to person, place, and time                                      Abdomen -FHR 145                                         Questions were answered. Assessment: LROB G1P0000 @ [redacted]w[redacted]d   Plan:  Continued routine obstetrical care,  1. urine culture 2. Child birthing classes  F/u in 4 weeks for LROB, anatomy check, U/S  By signing my name below, I, Diona Browner, attest that this documentation has been prepared under the direction and in the presence of Tilda Burrow, MD. Electronically Signed: Diona Browner, Medical Scribe. 01/03/17. 10:27 AM.  I personally performed the services described in this documentation, which was SCRIBED in my presence. The recorded information has been reviewed and considered accurate. It has been edited as necessary during review. Tilda Burrow, MD

## 2017-01-03 NOTE — Patient Instructions (Signed)
Please check out [email or web address]   for more information on childbirth classes  ° °[phone] is the phone number for Pregnancy Classes or hospital tours at [facility].  ° °You will be referred to  [email or web address]   for more information on childbirth classes   °At this site you may register for classes. You may sign up for a waiting list if classes are full. Please SIGN UP FOR THIS!.   When the waiting list becomes long, sometimes new classes can be added. ° ° ° ° ° °

## 2017-01-06 LAB — INTEGRATED 2
AFP MoM: 1.6
Alpha-Fetoprotein: 48.3 ng/mL
Crown Rump Length: 50.8 mm
DIA MoM: 1.09
DIA Value: 170.3 pg/mL
Estriol, Unconjugated: 0.66 ng/mL
Gest. Age on Collection Date: 11.9 wk
Gestational Age: 15.9 wk
Maternal Age at EDD: 20.5 a
Nuchal Translucency (NT): 1 mm
Nuchal Translucency MoM: 0.73
Number of Fetuses: 1
PAPP-A MoM: 2.48
PAPP-A Value: 1526.1 ng/mL
Test Results:: NEGATIVE
Weight: 172 [lb_av]
Weight: 174 [lb_av]
hCG MoM: 1.78
hCG Value: 57.2 [IU]/mL
uE3 MoM: 0.93

## 2017-01-29 ENCOUNTER — Other Ambulatory Visit: Payer: Self-pay | Admitting: Obstetrics and Gynecology

## 2017-01-29 DIAGNOSIS — Z363 Encounter for antenatal screening for malformations: Secondary | ICD-10-CM

## 2017-01-30 ENCOUNTER — Ambulatory Visit (INDEPENDENT_AMBULATORY_CARE_PROVIDER_SITE_OTHER): Payer: 59

## 2017-01-30 ENCOUNTER — Encounter: Payer: Self-pay | Admitting: Women's Health

## 2017-01-30 ENCOUNTER — Ambulatory Visit (INDEPENDENT_AMBULATORY_CARE_PROVIDER_SITE_OTHER): Payer: 59 | Admitting: Women's Health

## 2017-01-30 VITALS — BP 122/70 | HR 93 | Wt 183.0 lb

## 2017-01-30 DIAGNOSIS — Z1389 Encounter for screening for other disorder: Secondary | ICD-10-CM

## 2017-01-30 DIAGNOSIS — Z363 Encounter for antenatal screening for malformations: Secondary | ICD-10-CM | POA: Diagnosis not present

## 2017-01-30 DIAGNOSIS — Z331 Pregnant state, incidental: Secondary | ICD-10-CM

## 2017-01-30 DIAGNOSIS — Z3402 Encounter for supervision of normal first pregnancy, second trimester: Secondary | ICD-10-CM

## 2017-01-30 DIAGNOSIS — F1290 Cannabis use, unspecified, uncomplicated: Secondary | ICD-10-CM

## 2017-01-30 DIAGNOSIS — Z3A19 19 weeks gestation of pregnancy: Secondary | ICD-10-CM

## 2017-01-30 LAB — POCT URINALYSIS DIPSTICK
Blood, UA: NEGATIVE
Glucose, UA: NEGATIVE
Ketones, UA: NEGATIVE
Nitrite, UA: NEGATIVE
Protein, UA: NEGATIVE

## 2017-01-30 NOTE — Progress Notes (Signed)
   LOW-RISK PREGNANCY VISIT Patient name: Angelica Ramirez MRN [id number]  Date of birth: Jul 11, 1995 Chief Complaint:   Routine Prenatal Visit (u/s today)  History of Present Illness:   Angelica Ramirez is a 21 y.o. 211P0000 female at 1756w5d with an Estimated Date of Delivery: 06/21/17 being seen today for ongoing management of a low-risk pregnancy.  Today she reports no complaints. Discussed THC+ earlier in pregnancy, states none in a long time 'is out of my system by now'.  Vag. Bleeding: None.  Movement: Present. denies leaking of fluid. Review of Systems:   Pertinent items are noted in HPI Denies abnormal vaginal discharge w/ itching/odor/irritation, headaches, visual changes, shortness of breath, chest pain, abdominal pain, severe nausea/vomiting, or problems with urination or bowel movements unless otherwise stated above. Pertinent History Reviewed:  Reviewed past medical,surgical, social, obstetrical and family history.  Reviewed problem list, medications and allergies. Physical Assessment:   Vitals:   01/30/17 1050  BP: 122/70  Pulse: 93  Weight: 183 lb (83 kg)  Body mass index is 29.54 kg/m.        Physical Examination:   General appearance: Well appearing, and in no distress  Mental status: Alert, oriented to person, place, and time  Skin: Warm & dry  Cardiovascular: Normal heart rate noted  Respiratory: Normal respiratory effort, no distress  Abdomen: Soft, gravid, nontender  Pelvic: Cervical exam deferred    Extremities: Edema: None  Fetal Status: Fetal Heart Rate (bpm): 157 u/s   Movement: Present    Today's anatomy u/s: US 19+5 wks,cephalic,cx 4 cm,post pl gr 0,svp of fluid 3.8 cm,fhr 157 bpm,efw 293 g,anatomy complete,no obvious abnormalities   Results for orders placed or performed in visit on 01/30/17 (from the past 24 hour(s))  POCT urinalysis dipstick   Collection Time: 01/30/17 10:52 AM  Result Value Ref Range   Color, UA     Clarity, UA     Glucose, UA neg    Bilirubin, UA     Ketones, UA neg    Spec Grav, UA  1.010 - 1.025   Blood, UA neg    pH, UA  5.0 - 8.0   Protein, UA neg    Urobilinogen, UA  0.2 or 1.0 E.U./dL   Nitrite, UA neg    Leukocytes, UA Moderate (2+) (A) Negative    Assessment & Plan:  1) Low-risk pregnancy G1P0000 at 7156w5d with an Estimated Date of Delivery: 06/21/17   2) THC+ earlier pregnancy, reports has stopped, advised continued cessation, UDS today   Labs/procedures today: anatomy u/s  Plan:  Continue routine obstetrical care   Reviewed: normal nt/it results, normal anatomy u/s, Preterm labor symptoms and general obstetric precautions including but not limited to vaginal bleeding, contractions, leaking of fluid and fetal movement were reviewed in detail with the patient.  Recommended flu shot w/ pcp/hd (<21yo). All questions were answered.   Follow-up: Return in about 4 weeks (around 02/27/2017) for LROB.  Orders Placed This Encounter  Procedures  . Pain Management Screening Profile (10S)  . POCT urinalysis dipstick   Marge DuncansBooker, Almir Botts Randall CNM, [facility]NP-BC 01/30/2017 11:19 AM

## 2017-01-30 NOTE — Progress Notes (Signed)
US 19+5 wks,cephalic,cx 4 cm,post pl gr 0,svp of fluid 3.8 cm,fhr 157 bpm,efw 293 g,anatomy complete,no obvious abnormalities

## 2017-01-30 NOTE — Patient Instructions (Addendum)
[place] Pediatricians/Family Doctors:   Pediatrics [phone]            [facility] [phone]                  Family Medicine [phone] (usually not accepting new patients unless you have family there already, you are always welcome to call and ask)       [facility] [phone]       [place] Pediatricians/Family Doctors:   [facility]: [phone]  [facility]: [phone]   Second Trimester of Pregnancy The second trimester is from week 14 through week 27 (months 4 through 6). The second trimester is often a time when you feel your best. Your body has adjusted to being pregnant, and you begin to feel better physically. Usually, morning sickness has lessened or quit completely, you may have more energy, and you may have an increase in appetite. The second trimester is also a time when the fetus is growing rapidly. At the end of the sixth month, the fetus is about 9 inches long and weighs about 1 pounds. You will likely begin to feel the baby move (quickening) between 16 and 20 weeks of pregnancy. Body changes during your second trimester Your body continues to go through many changes during your second trimester. The changes vary from woman to woman.  Your weight will continue to increase. You will notice your lower abdomen bulging out.  You may begin to get stretch marks on your hips, abdomen, and breasts.  You may develop headaches that can be relieved by medicines. The medicines should be approved by your health care provider.  You may urinate more often because the fetus is pressing on your bladder.  You may develop or continue to have heartburn as a result of your pregnancy.  You may develop constipation because certain hormones are causing the muscles that push waste through your intestines to slow down.  You may develop hemorrhoids or swollen, bulging veins (varicose  veins).  You may have back pain. This is caused by: ? Weight gain. ? Pregnancy hormones that are relaxing the joints in your pelvis. ? A shift in weight and the muscles that support your balance.  Your breasts will continue to grow and they will continue to become tender.  Your gums may bleed and may be sensitive to brushing and flossing.  Dark spots or blotches (chloasma, mask of pregnancy) may develop on your face. This will likely fade after the baby is born.  A dark line from your belly button to the pubic area (linea nigra) may appear. This will likely fade after the baby is born.  You may have changes in your hair. These can include thickening of your hair, rapid growth, and changes in texture. Some women also have hair loss during or after pregnancy, or hair that feels dry or thin. Your hair will most likely return to normal after your baby is born.  What to expect at prenatal visits During a routine prenatal visit:  You will be weighed to make sure you and the fetus are growing normally.  Your blood pressure will be taken.  Your abdomen will be measured to track your baby's growth.  The fetal heartbeat will be listened to.  Any test results from the previous visit will be discussed.  Your health care provider may ask you:  How you are feeling.  If you are feeling the baby move.  If you have had any abnormal symptoms, such   as leaking fluid, bleeding, severe headaches, or abdominal cramping.  If you are using any tobacco products, including cigarettes, chewing tobacco, and electronic cigarettes.  If you have any questions.  Other tests that may be performed during your second trimester include:  Blood tests that check for: ? Low iron levels (anemia). ? High blood sugar that affects pregnant women (gestational diabetes) between 11 and 28 weeks. ? Rh antibodies. This is to check for a protein on red blood cells (Rh factor).  Urine tests to check for infections,  diabetes, or protein in the urine.  An ultrasound to confirm the proper growth and development of the baby.  An amniocentesis to check for possible genetic problems.  Fetal screens for spina bifida and Down syndrome.  HIV (human immunodeficiency virus) testing. Routine prenatal testing includes screening for HIV, unless you choose not to have this test.  Follow these instructions at home: Medicines  Follow your health care provider's instructions regarding medicine use. Specific medicines may be either safe or unsafe to take during pregnancy.  Take a prenatal vitamin that contains at least 600 micrograms (mcg) of folic acid.  If you develop constipation, try taking a stool softener if your health care provider approves. Eating and drinking  Eat a balanced diet that includes fresh fruits and vegetables, whole grains, good sources of protein such as meat, eggs, or tofu, and low-fat dairy. Your health care provider will help you determine the amount of weight gain that is right for you.  Avoid raw meat and uncooked cheese. These carry germs that can cause birth defects in the baby.  If you have low calcium intake from food, talk to your health care provider about whether you should take a daily calcium supplement.  Limit foods that are high in fat and processed sugars, such as fried and sweet foods.  To prevent constipation: ? Drink enough fluid to keep your urine clear or pale yellow. ? Eat foods that are high in fiber, such as fresh fruits and vegetables, whole grains, and beans. Activity  Exercise only as directed by your health care provider. Most women can continue their usual exercise routine during pregnancy. Try to exercise for 30 minutes at least 5 days a week. Stop exercising if you experience uterine contractions.  Avoid heavy lifting, wear low heel shoes, and practice good posture.  A sexual relationship may be continued unless your health care provider directs you  otherwise. Relieving pain and discomfort  Wear a good support bra to prevent discomfort from breast tenderness.  Take warm sitz baths to soothe any pain or discomfort caused by hemorrhoids. Use hemorrhoid cream if your health care provider approves.  Rest with your legs elevated if you have leg cramps or low back pain.  If you develop varicose veins, wear support hose. Elevate your feet for 15 minutes, 3-4 times a day. Limit salt in your diet. Prenatal Care  Write down your questions. Take them to your prenatal visits.  Keep all your prenatal visits as told by your health care provider. This is important. Safety  Wear your seat belt at all times when driving.  Make a list of emergency phone numbers, including numbers for family, friends, the hospital, and police and fire departments. General instructions  Ask your health care provider for a referral to a local prenatal education class. Begin classes no later than the beginning of month 6 of your pregnancy.  Ask for help if you have counseling or nutritional needs during pregnancy. Your  health care provider can offer advice or refer you to specialists for help with various needs.  Do not use hot tubs, steam rooms, or saunas.  Do not douche or use tampons or scented sanitary pads.  Do not cross your legs for long periods of time.  Avoid cat litter boxes and soil used by cats. These carry germs that can cause birth defects in the baby and possibly loss of the fetus by miscarriage or stillbirth.  Avoid all smoking, herbs, alcohol, and unprescribed drugs. Chemicals in these products can affect the formation and growth of the baby.  Do not use any products that contain nicotine or tobacco, such as cigarettes and e-cigarettes. If you need help quitting, ask your health care provider.  Visit your dentist if you have not gone yet during your pregnancy. Use a soft toothbrush to brush your teeth and be gentle when you floss. Contact a  health care provider if:  You have dizziness.  You have mild pelvic cramps, pelvic pressure, or nagging pain in the abdominal area.  You have persistent nausea, vomiting, or diarrhea.  You have a bad smelling vaginal discharge.  You have pain when you urinate. Get help right away if:  You have a fever.  You are leaking fluid from your vagina.  You have spotting or bleeding from your vagina.  You have severe abdominal cramping or pain.  You have rapid weight gain or weight loss.  You have shortness of breath with chest pain.  You notice sudden or extreme swelling of your face, hands, ankles, feet, or legs.  You have not felt your baby move in over an hour.  You have severe headaches that do not go away when you take medicine.  You have vision changes. Summary  The second trimester is from week 14 through week 27 (months 4 through 6). It is also a time when the fetus is growing rapidly.  Your body goes through many changes during pregnancy. The changes vary from woman to woman.  Avoid all smoking, herbs, alcohol, and unprescribed drugs. These chemicals affect the formation and growth your baby.  Do not use any tobacco products, such as cigarettes, chewing tobacco, and e-cigarettes. If you need help quitting, ask your health care provider.  Contact your health care provider if you have any questions. Keep all prenatal visits as told by your health care provider. This is important. This information is not intended to replace advice given to you by your health care provider. Make sure you discuss any questions you have with your health care provider. Document Released: 03/27/2001 Document Revised: 09/08/2015 Document Reviewed: 06/03/2012 Elsevier Interactive Patient Education  2017 [employer].

## 2017-01-31 LAB — PMP SCREEN PROFILE (10S), URINE
Amphetamine Scrn, Ur: NEGATIVE ng/mL
BARBITURATE SCREEN URINE: NEGATIVE ng/mL
BENZODIAZEPINE SCREEN, URINE: NEGATIVE ng/mL
CANNABINOIDS UR QL SCN: NEGATIVE ng/mL
Cocaine (Metab) Scrn, Ur: NEGATIVE ng/mL
Creatinine(Crt), U: 120.6 mg/dL (ref 20.0–300.0)
Methadone Screen, Urine: NEGATIVE ng/mL
OXYCODONE+OXYMORPHONE UR QL SCN: NEGATIVE ng/mL
Opiate Scrn, Ur: NEGATIVE ng/mL
Ph of Urine: 8 (ref 4.5–8.9)
Phencyclidine Qn, Ur: NEGATIVE ng/mL
Propoxyphene Scrn, Ur: NEGATIVE ng/mL

## 2017-02-23 DIAGNOSIS — H5213 Myopia, bilateral: Secondary | ICD-10-CM | POA: Diagnosis not present

## 2017-02-23 DIAGNOSIS — H52223 Regular astigmatism, bilateral: Secondary | ICD-10-CM | POA: Diagnosis not present

## 2017-02-27 ENCOUNTER — Encounter: Payer: Self-pay | Admitting: Advanced Practice Midwife

## 2017-02-27 ENCOUNTER — Ambulatory Visit (INDEPENDENT_AMBULATORY_CARE_PROVIDER_SITE_OTHER): Payer: 59 | Admitting: Advanced Practice Midwife

## 2017-02-27 VITALS — BP 120/80 | HR 99 | Wt 196.0 lb

## 2017-02-27 DIAGNOSIS — Z3A23 23 weeks gestation of pregnancy: Secondary | ICD-10-CM

## 2017-02-27 DIAGNOSIS — Z3402 Encounter for supervision of normal first pregnancy, second trimester: Secondary | ICD-10-CM

## 2017-02-27 DIAGNOSIS — Z331 Pregnant state, incidental: Secondary | ICD-10-CM

## 2017-02-27 DIAGNOSIS — Z1389 Encounter for screening for other disorder: Secondary | ICD-10-CM

## 2017-02-27 LAB — POCT URINALYSIS DIPSTICK
Glucose, UA: NEGATIVE
Ketones, UA: NEGATIVE
Nitrite, UA: NEGATIVE

## 2017-02-27 NOTE — Patient Instructions (Signed)

## 2017-02-27 NOTE — Progress Notes (Signed)
G1P0000 1542w5d Estimated Date of Delivery: 06/21/17  Blood pressure 120/80, pulse 99, weight 196 lb (88.9 kg), last menstrual period 09/14/2016.   BP weight and urine results all reviewed and noted.  Please refer to the obstetrical flow sheet for the fundal height and fetal heart rate documentation:  Patient reports good fetal movement, denies any bleeding and no rupture of membranes symptoms or regular contractions. Patient is without complaints. All questions were answered.  Orders Placed This Encounter  Procedures  . POCT urinalysis dipstick    Plan:  Continued routine obstetrical care,   Return in about 4 weeks (around 03/27/2017) for PN2/LROB.

## 2017-03-27 ENCOUNTER — Ambulatory Visit (INDEPENDENT_AMBULATORY_CARE_PROVIDER_SITE_OTHER): Payer: 59 | Admitting: Advanced Practice Midwife

## 2017-03-27 ENCOUNTER — Encounter: Payer: Self-pay | Admitting: Advanced Practice Midwife

## 2017-03-27 ENCOUNTER — Other Ambulatory Visit: Payer: 59

## 2017-03-27 VITALS — BP 112/56 | HR 107 | Wt 203.0 lb

## 2017-03-27 DIAGNOSIS — Z131 Encounter for screening for diabetes mellitus: Secondary | ICD-10-CM

## 2017-03-27 DIAGNOSIS — Z3403 Encounter for supervision of normal first pregnancy, third trimester: Secondary | ICD-10-CM | POA: Diagnosis not present

## 2017-03-27 DIAGNOSIS — Z331 Pregnant state, incidental: Secondary | ICD-10-CM

## 2017-03-27 DIAGNOSIS — Z1389 Encounter for screening for other disorder: Secondary | ICD-10-CM

## 2017-03-27 DIAGNOSIS — Z3A27 27 weeks gestation of pregnancy: Secondary | ICD-10-CM

## 2017-03-27 DIAGNOSIS — Z3402 Encounter for supervision of normal first pregnancy, second trimester: Secondary | ICD-10-CM

## 2017-03-27 LAB — POCT URINALYSIS DIPSTICK
Blood, UA: NEGATIVE
Glucose, UA: NEGATIVE
Ketones, UA: NEGATIVE
Nitrite, UA: NEGATIVE
Protein, UA: NEGATIVE

## 2017-03-27 NOTE — Progress Notes (Signed)
   LOW-RISK PREGNANCY VISIT Patient name: Angelica BalloonMalia A Ramirez MRN [id number]  Date of birth: 06-18-1995 Chief Complaint:   Routine Prenatal Visit (PN2 today)  History of Present Illness:   Angelica Ramirez is a 21 y.o. 811P0000 female at 2033w5d with an Estimated Date of Delivery: 07/03/17 being seen today for ongoing management of a low-risk pregnancy.  Today she reports no complaints. Contractions: Not present. Vag. Bleeding: None.  Movement: Present. denies leaking of fluid. Review of Systems:   Pertinent items are noted in HPI Denies abnormal vaginal discharge w/ itching/odor/irritation, headaches, visual changes, shortness of breath, chest pain, abdominal pain, severe nausea/vomiting, or problems with urination or bowel movements unless otherwise stated above.  Pertinent History Reviewed:  Medical & Surgical Hx:   Past Medical History:  Diagnosis Date  . Medical history non-contributory    Past Surgical History:  Procedure Laterality Date  . NO PAST SURGERIES     Family History  Problem Relation Age of Onset  . Diabetes Paternal Grandmother   . Diabetes Maternal Grandmother     Current Outpatient Medications:  .  flintstones complete (FLINTSTONES) 60 MG chewable tablet, Chew 1 tablet by mouth daily. (Patient taking differently: Chew 2 tablets by mouth daily. ), Disp: , Rfl:  Social History: Reviewed -  reports that  has never smoked. she has never used smokeless tobacco.  Physical Assessment:   Vitals:   03/27/17 0958  BP: (!) 112/56  Pulse: (!) 107  Weight: 203 lb (92.1 kg)  Body mass index is 32.77 kg/m.        Physical Examination:   General appearance: Well appearing, and in no distress  Mental status: Alert, oriented to person, place, and time  Skin: Warm & dry  Cardiovascular: Normal heart rate noted  Respiratory: Normal respiratory effort, no distress  Abdomen: Soft, gravid, nontender  Pelvic: Cervical exam deferred         Extremities: Edema: Trace  Fetal Status:      Movement: Present    Results for orders placed or performed in visit on 03/27/17 (from the past 24 hour(s))  POCT urinalysis dipstick   Collection Time: 03/27/17  9:59 AM  Result Value Ref Range   Color, UA     Clarity, UA     Glucose, UA neg    Bilirubin, UA     Ketones, UA neg    Spec Grav, UA  1.010 - 1.025   Blood, UA neg    pH, UA  5.0 - 8.0   Protein, UA neg    Urobilinogen, UA  0.2 or 1.0 E.U./dL   Nitrite, UA neg    Leukocytes, UA Small (1+) (A) Negative   Appearance     Odor      Assessment & Plan:  1) Low-risk pregnancy G1P0000 at 2033w5d with an Estimated Date of Delivery: 06/21/17   2) ,    Labs/procedures/US today: PN2  Plan:  Continue routine obstetrical care    Follow-up: Return in about 3 weeks (around 04/17/2017) for LROB.  Orders Placed This Encounter  Procedures  . POCT urinalysis dipstick   CRESENZO-DISHMAN,Mallisa [place] CNM 03/27/2017 10:12 AM

## 2017-03-27 NOTE — Patient Instructions (Signed)
Angelica Ramirez, I greatly value your feedback.  If you receive a survey following your visit with us today, we appreciate you taking the time to fill it out.  Thanks, Cathie BeamsFran Cresenzo-Dishmon, CNM   Call the office [phone]) or go to [facility] if:  You begin to have strong, frequent contractions  Your water breaks.  Sometimes it is a big gush of fluid, sometimes it is just a trickle that keeps getting your panties wet or running down your legs  You have vaginal bleeding.  It is normal to have a small amount of spotting if your cervix was checked.   You don't feel your baby moving like normal.  If you don't, get you something to eat and drink and lay down and focus on feeling your baby move.  You should feel at least 10 movements in 2 hours.  If you don't, you should call the office or go to [facility].    Tdap Vaccine  It is recommended that you get the Tdap vaccine during the third trimester of EACH pregnancy to help protect your baby from getting pertussis (whooping cough)  27-36 weeks is the BEST time to do this so that you can pass the protection on to your baby. During pregnancy is better than after pregnancy, but if you are unable to get it during pregnancy it will be offered at the hospital.   You can get this vaccine at the health department or your family doctor  Everyone who will be around your baby should also be up-to-date on their vaccines. Adults (who are not pregnant) only need 1 dose of Tdap during adulthood.   Third Trimester of Pregnancy The third trimester is from week 29 through week 42, months 7 through 9. The third trimester is a time when the fetus is growing rapidly. At the end of the ninth month, the fetus is about 20 inches in length and weighs 6-10 pounds.  BODY CHANGES Your body goes through many changes during pregnancy. The changes vary from woman to woman.   Your weight will continue to increase. You can expect to gain 25-35 pounds (11-16 kg) by  the end of the pregnancy.  You may begin to get stretch marks on your hips, abdomen, and breasts.  You may urinate more often because the fetus is moving lower into your pelvis and pressing on your bladder.  You may develop or continue to have heartburn as a result of your pregnancy.  You may develop constipation because certain hormones are causing the muscles that push waste through your intestines to slow down.  You may develop hemorrhoids or swollen, bulging veins (varicose veins).  You may have pelvic pain because of the weight gain and pregnancy hormones relaxing your joints between the bones in your pelvis. Backaches may result from overexertion of the muscles supporting your posture.  You may have changes in your hair. These can include thickening of your hair, rapid growth, and changes in texture. Some women also have hair loss during or after pregnancy, or hair that feels dry or thin. Your hair will most likely return to normal after your baby is born.  Your breasts will continue to grow and be tender. A yellow discharge may leak from your breasts called colostrum.  Your belly button may stick out.  You may feel short of breath because of your expanding uterus.  You may notice the fetus "dropping," or moving lower in your abdomen.  You may have a bloody mucus  discharge. This usually occurs a few days to a week before labor begins.  Your cervix becomes thin and soft (effaced) near your due date. WHAT TO EXPECT AT YOUR PRENATAL EXAMS  You will have prenatal exams every 2 weeks until week 36. Then, you will have weekly prenatal exams. During a routine prenatal visit:  You will be weighed to make sure you and the fetus are growing normally.  Your blood pressure is taken.  Your abdomen will be measured to track your baby's growth.  The fetal heartbeat will be listened to.  Any test results from the previous visit will be discussed.  You may have a cervical check near your  due date to see if you have effaced. At around 36 weeks, your caregiver will check your cervix. At the same time, your caregiver will also perform a test on the secretions of the vaginal tissue. This test is to determine if a type of bacteria, Group B streptococcus, is present. Your caregiver will explain this further. Your caregiver may ask you:  What your birth plan is.  How you are feeling.  If you are feeling the baby move.  If you have had any abnormal symptoms, such as leaking fluid, bleeding, severe headaches, or abdominal cramping.  If you have any questions. Other tests or screenings that may be performed during your third trimester include:  Blood tests that check for low iron levels (anemia).  Fetal testing to check the health, activity level, and growth of the fetus. Testing is done if you have certain medical conditions or if there are problems during the pregnancy. FALSE LABOR You may feel small, irregular contractions that eventually go away. These are called Braxton Hicks contractions, or false labor. Contractions may last for hours, days, or even weeks before true labor sets in. If contractions come at regular intervals, intensify, or become painful, it is best to be seen by your caregiver.  SIGNS OF LABOR   Menstrual-like cramps.  Contractions that are 5 minutes apart or less.  Contractions that start on the top of the uterus and spread down to the lower abdomen and back.  A sense of increased pelvic pressure or back pain.  A watery or bloody mucus discharge that comes from the vagina. If you have any of these signs before the 37th week of pregnancy, call your caregiver right away. You need to go to the hospital to get checked immediately. HOME CARE INSTRUCTIONS   Avoid all smoking, herbs, alcohol, and unprescribed drugs. These chemicals affect the formation and growth of the baby.  Follow your caregiver's instructions regarding medicine use. There are medicines  that are either safe or unsafe to take during pregnancy.  Exercise only as directed by your caregiver. Experiencing uterine cramps is a good sign to stop exercising.  Continue to eat regular, healthy meals.  Wear a good support bra for breast tenderness.  Do not use hot tubs, steam rooms, or saunas.  Wear your seat belt at all times when driving.  Avoid raw meat, uncooked cheese, cat litter boxes, and soil used by cats. These carry germs that can cause birth defects in the baby.  Take your prenatal vitamins.  Try taking a stool softener (if your caregiver approves) if you develop constipation. Eat more high-fiber foods, such as fresh vegetables or fruit and whole grains. Drink plenty of fluids to keep your urine clear or pale yellow.  Take warm sitz baths to soothe any pain or discomfort caused by hemorrhoids. Use  hemorrhoid cream if your caregiver approves.  If you develop varicose veins, wear support hose. Elevate your feet for 15 minutes, 3-4 times a day. Limit salt in your diet.  Avoid heavy lifting, wear low heal shoes, and practice good posture.  Rest a lot with your legs elevated if you have leg cramps or low back pain.  Visit your dentist if you have not gone during your pregnancy. Use a soft toothbrush to brush your teeth and be gentle when you floss.  A sexual relationship may be continued unless your caregiver directs you otherwise.  Do not travel far distances unless it is absolutely necessary and only with the approval of your caregiver.  Take prenatal classes to understand, practice, and ask questions about the labor and delivery.  Make a trial run to the hospital.  Pack your hospital bag.  Prepare the baby's nursery.  Continue to go to all your prenatal visits as directed by your caregiver. SEEK MEDICAL CARE IF:  You are unsure if you are in labor or if your water has broken.  You have dizziness.  You have mild pelvic cramps, pelvic pressure, or nagging  pain in your abdominal area.  You have persistent nausea, vomiting, or diarrhea.  You have a bad smelling vaginal discharge.  You have pain with urination. SEEK IMMEDIATE MEDICAL CARE IF:   You have a fever.  You are leaking fluid from your vagina.  You have spotting or bleeding from your vagina.  You have severe abdominal cramping or pain.  You have rapid weight loss or gain.  You have shortness of breath with chest pain.  You notice sudden or extreme swelling of your face, hands, ankles, feet, or legs.  You have not felt your baby move in over an hour.  You have severe headaches that do not go away with medicine.  You have vision changes. Document Released: 03/27/2001 Document Revised: 04/07/2013 Document Reviewed: 06/03/2012 [facility] Patient Information 2015 [place], [place]. This information is not intended to replace advice given to you by your health care provider. Make sure you discuss any questions you have with your health care provider.

## 2017-03-28 LAB — CBC
Hematocrit: 35 % (ref 34.0–46.6)
Hemoglobin: 11.3 g/dL (ref 11.1–15.9)
MCH: 28.7 pg (ref 26.6–33.0)
MCHC: 32.3 g/dL (ref 31.5–35.7)
MCV: 89 fL (ref 79–97)
Platelets: 235 10*3/uL (ref 150–379)
RBC: 3.94 x10E6/uL (ref 3.77–5.28)
RDW: 13 % (ref 12.3–15.4)
WBC: 10.8 10*3/uL (ref 3.4–10.8)

## 2017-03-28 LAB — GLUCOSE TOLERANCE, 2 HOURS W/ 1HR
Glucose, 1 hour: 172 mg/dL (ref 65–179)
Glucose, 2 hour: 133 mg/dL (ref 65–152)
Glucose, Fasting: 77 mg/dL (ref 65–91)

## 2017-03-28 LAB — HIV ANTIBODY (ROUTINE TESTING W REFLEX): HIV Screen 4th Generation wRfx: NONREACTIVE

## 2017-03-28 LAB — ANTIBODY SCREEN: Antibody Screen: NEGATIVE

## 2017-03-28 LAB — RPR: RPR Ser Ql: NONREACTIVE

## 2017-04-16 NOTE — L&D Delivery Note (Signed)
Patient is 22 y.o. G1P0000 7179w5d admitted for SOL. S/p augmentation with Pitocin. AROM at 1110.  [facility].  Delivery Note At 8:53 PM a viable female was delivered via Vaginal, Spontaneous (Presentation: LOA).  APGAR: 8, 9; weight pending.   Placenta status: Intact.  Cord: 3V with the following complications: None.  Cord pH: N/A  Anesthesia: Epidural  Episiotomy: None Lacerations: Periurethral, Sulcus Suture Repair: 3.0 vicryl Est. Blood Loss (mL): 200  Mom to postpartum.  Baby to Couplet care / Skin to Skin.  Angelica AdaJazma Tionne Carelli, DO 06/19/2017, 9:22 PM

## 2017-04-18 ENCOUNTER — Encounter: Payer: 59 | Admitting: Obstetrics & Gynecology

## 2017-04-24 ENCOUNTER — Encounter: Payer: Self-pay | Admitting: Women's Health

## 2017-04-24 ENCOUNTER — Ambulatory Visit (INDEPENDENT_AMBULATORY_CARE_PROVIDER_SITE_OTHER): Payer: Medicaid Other | Admitting: Women's Health

## 2017-04-24 VITALS — BP 110/80 | HR 97 | Wt 214.0 lb

## 2017-04-24 DIAGNOSIS — Z3A31 31 weeks gestation of pregnancy: Secondary | ICD-10-CM

## 2017-04-24 DIAGNOSIS — Z3403 Encounter for supervision of normal first pregnancy, third trimester: Secondary | ICD-10-CM

## 2017-04-24 DIAGNOSIS — Z331 Pregnant state, incidental: Secondary | ICD-10-CM

## 2017-04-24 DIAGNOSIS — Z1389 Encounter for screening for other disorder: Secondary | ICD-10-CM

## 2017-04-24 LAB — POCT URINALYSIS DIPSTICK
Blood, UA: NEGATIVE
Glucose, UA: NEGATIVE
Leukocytes, UA: NEGATIVE
Nitrite, UA: NEGATIVE

## 2017-04-24 NOTE — Patient Instructions (Signed)
Angelica BalloonMalia A Ramirez, I greatly value your feedback.  If you receive a survey following your visit with us today, we appreciate you taking the time to fill it out.  Thanks, Joellyn HaffKim Booker, CNM, WHNP-BC   Call the office [phone]) or go to [facility] if:  You begin to have strong, frequent contractions  Your water breaks.  Sometimes it is a big gush of fluid, sometimes it is just a trickle that keeps getting your panties wet or running down your legs  You have vaginal bleeding.  It is normal to have a small amount of spotting if your cervix was checked.   You don't feel your baby moving like normal.  If you don't, get you something to eat and drink and lay down and focus on feeling your baby move.  You should feel at least 10 movements in 2 hours.  If you don't, you should call the office or go to [facility].    Tdap Vaccine  It is recommended that you get the Tdap vaccine during the third trimester of EACH pregnancy to help protect your baby from getting pertussis (whooping cough)  27-36 weeks is the BEST time to do this so that you can pass the protection on to your baby. During pregnancy is better than after pregnancy, but if you are unable to get it during pregnancy it will be offered at the hospital.   You can get this vaccine at the health department or your family doctor  Everyone who will be around your baby should also be up-to-date on their vaccines. Adults (who are not pregnant) only need 1 dose of Tdap during adulthood.   Third Trimester of Pregnancy The third trimester is from week 29 through week 42, months 7 through 9. The third trimester is a time when the fetus is growing rapidly. At the end of the ninth month, the fetus is about 20 inches in length and weighs 6-10 pounds.  BODY CHANGES Your body goes through many changes during pregnancy. The changes vary from woman to woman.   Your weight will continue to increase. You can expect to gain 25-35 pounds (11-16 kg) by  the end of the pregnancy.  You may begin to get stretch marks on your hips, abdomen, and breasts.  You may urinate more often because the fetus is moving lower into your pelvis and pressing on your bladder.  You may develop or continue to have heartburn as a result of your pregnancy.  You may develop constipation because certain hormones are causing the muscles that push waste through your intestines to slow down.  You may develop hemorrhoids or swollen, bulging veins (varicose veins).  You may have pelvic pain because of the weight gain and pregnancy hormones relaxing your joints between the bones in your pelvis. Backaches may result from overexertion of the muscles supporting your posture.  You may have changes in your hair. These can include thickening of your hair, rapid growth, and changes in texture. Some women also have hair loss during or after pregnancy, or hair that feels dry or thin. Your hair will most likely return to normal after your baby is born.  Your breasts will continue to grow and be tender. A yellow discharge may leak from your breasts called colostrum.  Your belly button may stick out.  You may feel short of breath because of your expanding uterus.  You may notice the fetus "dropping," or moving lower in your abdomen.  You may have a bloody  mucus discharge. This usually occurs a few days to a week before labor begins.  Your cervix becomes thin and soft (effaced) near your due date. WHAT TO EXPECT AT YOUR PRENATAL EXAMS  You will have prenatal exams every 2 weeks until week 36. Then, you will have weekly prenatal exams. During a routine prenatal visit:  You will be weighed to make sure you and the fetus are growing normally.  Your blood pressure is taken.  Your abdomen will be measured to track your baby's growth.  The fetal heartbeat will be listened to.  Any test results from the previous visit will be discussed.  You may have a cervical check near your  due date to see if you have effaced. At around 36 weeks, your caregiver will check your cervix. At the same time, your caregiver will also perform a test on the secretions of the vaginal tissue. This test is to determine if a type of bacteria, Group B streptococcus, is present. Your caregiver will explain this further. Your caregiver may ask you:  What your birth plan is.  How you are feeling.  If you are feeling the baby move.  If you have had any abnormal symptoms, such as leaking fluid, bleeding, severe headaches, or abdominal cramping.  If you have any questions. Other tests or screenings that may be performed during your third trimester include:  Blood tests that check for low iron levels (anemia).  Fetal testing to check the health, activity level, and growth of the fetus. Testing is done if you have certain medical conditions or if there are problems during the pregnancy. FALSE LABOR You may feel small, irregular contractions that eventually go away. These are called Braxton Hicks contractions, or false labor. Contractions may last for hours, days, or even weeks before true labor sets in. If contractions come at regular intervals, intensify, or become painful, it is best to be seen by your caregiver.  SIGNS OF LABOR   Menstrual-like cramps.  Contractions that are 5 minutes apart or less.  Contractions that start on the top of the uterus and spread down to the lower abdomen and back.  A sense of increased pelvic pressure or back pain.  A watery or bloody mucus discharge that comes from the vagina. If you have any of these signs before the 37th week of pregnancy, call your caregiver right away. You need to go to the hospital to get checked immediately. HOME CARE INSTRUCTIONS   Avoid all smoking, herbs, alcohol, and unprescribed drugs. These chemicals affect the formation and growth of the baby.  Follow your caregiver's instructions regarding medicine use. There are medicines  that are either safe or unsafe to take during pregnancy.  Exercise only as directed by your caregiver. Experiencing uterine cramps is a good sign to stop exercising.  Continue to eat regular, healthy meals.  Wear a good support bra for breast tenderness.  Do not use hot tubs, steam rooms, or saunas.  Wear your seat belt at all times when driving.  Avoid raw meat, uncooked cheese, cat litter boxes, and soil used by cats. These carry germs that can cause birth defects in the baby.  Take your prenatal vitamins.  Try taking a stool softener (if your caregiver approves) if you develop constipation. Eat more high-fiber foods, such as fresh vegetables or fruit and whole grains. Drink plenty of fluids to keep your urine clear or pale yellow.  Take warm sitz baths to soothe any pain or discomfort caused by hemorrhoids.  Use hemorrhoid cream if your caregiver approves.  If you develop varicose veins, wear support hose. Elevate your feet for 15 minutes, 3-4 times a day. Limit salt in your diet.  Avoid heavy lifting, wear low heal shoes, and practice good posture.  Rest a lot with your legs elevated if you have leg cramps or low back pain.  Visit your dentist if you have not gone during your pregnancy. Use a soft toothbrush to brush your teeth and be gentle when you floss.  A sexual relationship may be continued unless your caregiver directs you otherwise.  Do not travel far distances unless it is absolutely necessary and only with the approval of your caregiver.  Take prenatal classes to understand, practice, and ask questions about the labor and delivery.  Make a trial run to the hospital.  Pack your hospital bag.  Prepare the baby's nursery.  Continue to go to all your prenatal visits as directed by your caregiver. SEEK MEDICAL CARE IF:  You are unsure if you are in labor or if your water has broken.  You have dizziness.  You have mild pelvic cramps, pelvic pressure, or nagging  pain in your abdominal area.  You have persistent nausea, vomiting, or diarrhea.  You have a bad smelling vaginal discharge.  You have pain with urination. SEEK IMMEDIATE MEDICAL CARE IF:   You have a fever.  You are leaking fluid from your vagina.  You have spotting or bleeding from your vagina.  You have severe abdominal cramping or pain.  You have rapid weight loss or gain.  You have shortness of breath with chest pain.  You notice sudden or extreme swelling of your face, hands, ankles, feet, or legs.  You have not felt your baby move in over an hour.  You have severe headaches that do not go away with medicine.  You have vision changes. Document Released: 03/27/2001 Document Revised: 04/07/2013 Document Reviewed: 06/03/2012 [employer] Patient Information 2015 [employer]. This information is not intended to replace advice given to you by your health care provider. Make sure you discuss any questions you have with your health care provider.   

## 2017-04-24 NOTE — Progress Notes (Signed)
   LOW-RISK PREGNANCY VISIT Patient name: Angelica Ramirez MRN [id number]  Date of birth: May 22, 1995 Chief Complaint:   Routine Prenatal Visit  History of Present Illness:   Angelica Ramirez is a 22 y.o. 411P0000 female at 3877w5d with an Estimated Date of Delivery: 06/21/17 being seen today for ongoing management of a low-risk pregnancy.  Today she reports no complaints. Contractions: Not present.  .  Movement: Present. denies leaking of fluid. Review of Systems:   Pertinent items are noted in HPI Denies abnormal vaginal discharge w/ itching/odor/irritation, headaches, visual changes, shortness of breath, chest pain, abdominal pain, severe nausea/vomiting, or problems with urination or bowel movements unless otherwise stated above. Pertinent History Reviewed:  Reviewed past medical,surgical, social, obstetrical and family history.  Reviewed problem list, medications and allergies. Physical Assessment:   Vitals:   04/24/17 1204  BP: 110/80  Pulse: 97  Weight: 214 lb (97.1 kg)  Body mass index is 34.54 kg/m.        Physical Examination:   General appearance: Well appearing, and in no distress  Mental status: Alert, oriented to person, place, and time  Skin: Warm & dry  Cardiovascular: Normal heart rate noted  Respiratory: Normal respiratory effort, no distress  Abdomen: Soft, gravid, nontender  Pelvic: Cervical exam deferred         Extremities: Edema: None  Fetal Status: Fetal Heart Rate (bpm): 145 Fundal Height: 32 cm Movement: Present    Results for orders placed or performed in visit on 04/24/17 (from the past 24 hour(s))  POCT urinalysis dipstick   Collection Time: 04/24/17 12:14 PM  Result Value Ref Range   Color, UA     Clarity, UA     Glucose, UA neg    Bilirubin, UA     Ketones, UA small    Spec Grav, UA  1.010 - 1.025   Blood, UA neg    pH, UA  5.0 - 8.0   Protein, UA trace    Urobilinogen, UA  0.2 or 1.0 E.U./dL   Nitrite, UA neg    Leukocytes, UA Negative  Negative   Appearance     Odor      Assessment & Plan:  1) Low-risk pregnancy G1P0000 at 1177w5d with an Estimated Date of Delivery: 06/21/17    Meds: No orders of the defined types were placed in this encounter.  Labs/procedures today: none  Plan:  Continue routine obstetrical care   Reviewed: Preterm labor symptoms and general obstetric precautions including but not limited to vaginal bleeding, contractions, leaking of fluid and fetal movement were reviewed in detail with the patient. Recommended Tdap at HD/PCP per CDC guidelines.  All questions were answered  Follow-up: Return in about 2 weeks (around 05/08/2017) for LROB.  Orders Placed This Encounter  Procedures  . POCT urinalysis dipstick   Marge DuncansBooker, Jahna Liebert Randall CNM, [facility] 04/24/2017 12:32 PM

## 2017-05-08 ENCOUNTER — Encounter: Payer: Medicaid Other | Admitting: Advanced Practice Midwife

## 2017-05-13 ENCOUNTER — Ambulatory Visit (INDEPENDENT_AMBULATORY_CARE_PROVIDER_SITE_OTHER): Payer: Medicaid Other | Admitting: Obstetrics and Gynecology

## 2017-05-13 ENCOUNTER — Encounter: Payer: Self-pay | Admitting: Obstetrics and Gynecology

## 2017-05-13 VITALS — BP 120/60 | HR 98 | Wt 217.6 lb

## 2017-05-13 DIAGNOSIS — O99810 Abnormal glucose complicating pregnancy: Secondary | ICD-10-CM | POA: Diagnosis not present

## 2017-05-13 DIAGNOSIS — Z1389 Encounter for screening for other disorder: Secondary | ICD-10-CM

## 2017-05-13 DIAGNOSIS — Z3A34 34 weeks gestation of pregnancy: Secondary | ICD-10-CM

## 2017-05-13 DIAGNOSIS — Z331 Pregnant state, incidental: Secondary | ICD-10-CM

## 2017-05-13 DIAGNOSIS — R81 Glycosuria: Secondary | ICD-10-CM

## 2017-05-13 DIAGNOSIS — Z3403 Encounter for supervision of normal first pregnancy, third trimester: Secondary | ICD-10-CM

## 2017-05-13 LAB — POCT URINALYSIS DIPSTICK
Blood, UA: NEGATIVE
Ketones, UA: NEGATIVE
Leukocytes, UA: NEGATIVE
Nitrite, UA: NEGATIVE
Protein, UA: NEGATIVE

## 2017-05-13 LAB — GLUCOSE, POCT (MANUAL RESULT ENTRY): POC Glucose: 80 mg/dl (ref 70–99)

## 2017-05-13 NOTE — Progress Notes (Signed)
Patient ID: Angelica Ramirez Ponti, female   DOB: Apr 02, 1996, 22 y.o.   MRN: [id number]   LOW-RISK PREGNANCY VISIT Patient name: Angelica Ramirez Karn MRN [id number]  Date of birth: Apr 02, 1996 Chief Complaint:   Routine Prenatal Visit  History of Present Illness:   Angelica Ramirez Joss is Ramirez 22 y.o. 441P0000 female at 5366w3d with an Estimated Date of Delivery: 06/21/17 being seen today for ongoing management of Ramirez low-risk pregnancy.  Today she reports sitting for long periods of time will cause her dismfort. She does state that it is getting harder for her to move. She would like Ramirez note for her job that allows her to move around and in general be more accommodating of her pregnancy.  Contractions: Not present. Vag. Bleeding: None.  Movement: Present. denies leaking of fluid. Review of Systems:   Pertinent items are noted in HPI Denies abnormal vaginal discharge w/ itching/odor/irritation, headaches, visual changes, shortness of breath, chest pain, abdominal pain, severe nausea/vomiting, or problems with urination or bowel movements unless otherwise stated above. Pertinent History Reviewed:  Reviewed past medical,surgical, social, obstetrical and family history.  Reviewed problem list, medications and allergies. Physical Assessment:   Vitals:   05/13/17 1351  BP: 120/60  Pulse: 98  Weight: 217 lb 9.6 oz (98.7 kg)  Body mass index is 35.12 kg/m.        Physical Examination:   General appearance: Well appearing, and in no distress  Mental status: Alert, oriented to person, place, and time  Skin: Warm & dry  Cardiovascular: Normal heart rate noted  Respiratory: Normal respiratory effort, no distress  Abdomen: Soft, gravid, nontender  Pelvic: Cervical exam deferred         Extremities: Edema: None  Fetal Status: Fetal Heart Rate (bpm): 137 Fundal Height: 33 cm Movement: Present    Results for orders placed or performed in visit on 05/13/17 (from the past 24 hour(s))  POCT Urinalysis Dipstick   Collection  Time: 05/13/17  1:57 PM  Result Value Ref Range   Color, UA     Clarity, UA     Glucose, UA 3+    Bilirubin, UA     Ketones, UA neg    Spec Grav, UA  1.010 - 1.025   Blood, UA neg    pH, UA  5.0 - 8.0   Protein, UA neg    Urobilinogen, UA  0.2 or 1.0 E.U./dL   Nitrite, UA neg    Leukocytes, UA Negative Negative   Appearance     Odor    POCT Glucose (CBG)   Collection Time: 05/13/17  1:57 PM  Result Value Ref Range   POC Glucose 80 70 - 99 mg/dl    Assessment & Plan:  1) Low-risk pregnancy G1P0000 at 9366w3d with an Estimated Date of Delivery: 06/21/17     Meds: No orders of the defined types were placed in this encounter.  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  Follow-up: Return in about 2 weeks (around 05/27/2017) for LROB.  Orders Placed This Encounter  Procedures  . POCT Urinalysis Dipstick  . POCT Glucose (CBG)   By signing my name below, I, Diona BrownerJennifer Gorman, attest that this documentation has been prepared under the direction  and in the presence of Tilda Burrow, MD. Electronically Signed: Diona Browner, Medical Scribe. 05/13/17. 2:09 PM.  I personally performed the services described in this documentation, which was SCRIBED in my presence. The recorded information has been reviewed and considered accurate. It has been edited as necessary during review. Tilda Burrow, MD

## 2017-05-13 NOTE — Patient Instructions (Signed)
(  [phone] is the phone number for Pregnancy Classes or hospital tours at [facility].  ° °You will be referred to  [email or web address]   for more information on childbirth classes   °At this site you may register for classes. You may sign up for a waiting list if classes are full. Please SIGN UP FOR THIS!.   When the waiting list becomes long, sometimes new classes can be added. ° ° ° °

## 2017-05-24 DIAGNOSIS — Z029 Encounter for administrative examinations, unspecified: Secondary | ICD-10-CM

## 2017-05-27 ENCOUNTER — Encounter: Payer: Medicaid Other | Admitting: Women's Health

## 2017-05-28 ENCOUNTER — Telehealth: Payer: Self-pay | Admitting: *Deleted

## 2017-05-28 NOTE — Telephone Encounter (Signed)
Patient called with complaints of nausea and vomiting for 3 days. She is questioning if she is in labor. Informed patient GI symptoms can be a sign of labor but could also be a virus. She is not having any contractions just pressure. Advised patient to try immodium and offered nausea medication but patient declined. Also encouraged her to push fluids to avoid dehydration and to monitor for contractions and to watch her bleeding or leaking. Verbalized understanding.

## 2017-05-30 ENCOUNTER — Ambulatory Visit (INDEPENDENT_AMBULATORY_CARE_PROVIDER_SITE_OTHER): Payer: Medicaid Other | Admitting: Advanced Practice Midwife

## 2017-05-30 ENCOUNTER — Encounter: Payer: Self-pay | Admitting: Advanced Practice Midwife

## 2017-05-30 VITALS — BP 100/60 | HR 98 | Wt 221.0 lb

## 2017-05-30 DIAGNOSIS — O9989 Other specified diseases and conditions complicating pregnancy, childbirth and the puerperium: Secondary | ICD-10-CM

## 2017-05-30 DIAGNOSIS — Z3483 Encounter for supervision of other normal pregnancy, third trimester: Secondary | ICD-10-CM

## 2017-05-30 DIAGNOSIS — Z3403 Encounter for supervision of normal first pregnancy, third trimester: Secondary | ICD-10-CM

## 2017-05-30 DIAGNOSIS — Z1389 Encounter for screening for other disorder: Secondary | ICD-10-CM

## 2017-05-30 DIAGNOSIS — Z331 Pregnant state, incidental: Secondary | ICD-10-CM

## 2017-05-30 DIAGNOSIS — N898 Other specified noninflammatory disorders of vagina: Secondary | ICD-10-CM

## 2017-05-30 DIAGNOSIS — Z3A36 36 weeks gestation of pregnancy: Secondary | ICD-10-CM

## 2017-05-30 LAB — POCT URINALYSIS DIPSTICK
Blood, UA: NEGATIVE
Glucose, UA: NEGATIVE
Ketones, UA: NEGATIVE
Leukocytes, UA: NEGATIVE
Nitrite, UA: NEGATIVE

## 2017-05-30 NOTE — Patient Instructions (Addendum)
AM I IN LABOR? What is labor? Labor is the work that your body does to birth your baby. Your uterus (the womb) contracts. Your cervix (the mouth of the uterus) opens. You will push your baby out into the world.  What do contractions (labor pains) feel like? When they first start, contractions usually feel like cramps during your period. Sometimes you feel pain in your back. Most often, contractions feel like muscles pulling painfully in your lower belly. At first, the contractions will probably be 15 to 20 minutes apart. They will not feel too painful. As labor goes on, the contractions get stronger, closer together, and more painful.  How do I time the contractions? Time your contractions by counting the number of minutes from the start of one contraction to the start of the next contraction.  What should I do when the contractions start? If it is night and you can sleep, sleep. If it happens during the day, here are some things you can do to take care of yourself at home: ? Walk. If the pains you are having are real labor, walking will make the contractions come faster and harder. If the contractions are not going to continue and be real labor, walking will make the contractions slow down. ? Take a shower or bath. This will help you relax. ? Eat. Labor is a big event. It takes a lot of energy. ? Drink water. Not drinking enough water can cause false labor (contractions that hurt but do not open your cervix). If this is true labor, drinking water will help you have strength to get through your labor. ? Take a nap. Get all the rest you can. ? Get a massage. If your labor is in your back, a strong massage on your lower back may feel very good. Getting a foot massage is always good. ? Don't panic. You can do this. Your body was made for this. You are strong!  When should I go to the hospital or call my health care provider? ? Your contractions have been 5 minutes apart or less for at least 1  hour. ? If several contractions are so painful you cannot walk or talk during one. ? Your bag of waters breaks. (You may have a big gush of water or just water that runs down your legs when you walk.)  Are there other reasons to call my health care provider? Yes, you should call your health care provider or go to the hospital if you start to bleed like you are having a period- blood that soaks your underwear or runs down your legs, if you have sudden severe pain, if your baby has not moved for several hours, or if you are leaking green fluid. The rule is as follows: If you are very concerned about something, call      Cervical Ripening: May try one or both  Red Raspberry Leaf capsules: two 300mg or 400mg tablets with each meal, 2-3 times a day  Potential Side Effects Of Raspberry Leaf:  Most women do not experience any side effects from drinking raspberry leaf tea. However, nausea and loose stools are possible   Evening Primrose Oil capsules: 3 500 mg capsules daily.  Some of the potential side effects:  Upset stomach  Loose stools or diarrhea  Headaches  Nausea:    .  

## 2017-05-30 NOTE — Progress Notes (Signed)
G1P0000 7065w6d Estimated Date of Delivery: 06/21/17  Blood pressure 100/60, pulse 98, weight 221 lb (100.2 kg), last menstrual period 09/14/2016.   BP weight and urine results all reviewed and noted.  Please refer to the obstetrical flow sheet for the fundal height and fetal heart rate documentation:  Patient reports good fetal movement, denies any bleeding and no rupture of membranes symptoms or regular contractions. Patient has itchy vagina.  Looks like yeast All questions were answered.  Orders Placed This Encounter  Procedures  . Strep Gp B NAA  . GC/Chlamydia Probe Amp  . POCT urinalysis dipstick    Plan:  Continued routine obstetrical care, OTC yeast med  Return in about 1 week (around 06/06/2017) for LROB.

## 2017-06-01 LAB — GC/CHLAMYDIA PROBE AMP
Chlamydia trachomatis, NAA: NEGATIVE
Neisseria gonorrhoeae by PCR: NEGATIVE

## 2017-06-01 LAB — STREP GP B NAA: Strep Gp B NAA: POSITIVE — AB

## 2017-06-05 ENCOUNTER — Ambulatory Visit (INDEPENDENT_AMBULATORY_CARE_PROVIDER_SITE_OTHER): Payer: Medicaid Other | Admitting: Women's Health

## 2017-06-05 ENCOUNTER — Encounter: Payer: Self-pay | Admitting: Women's Health

## 2017-06-05 VITALS — BP 112/60 | HR 105 | Wt 223.0 lb

## 2017-06-05 DIAGNOSIS — Z1389 Encounter for screening for other disorder: Secondary | ICD-10-CM

## 2017-06-05 DIAGNOSIS — Z3A37 37 weeks gestation of pregnancy: Secondary | ICD-10-CM | POA: Diagnosis not present

## 2017-06-05 DIAGNOSIS — Z3403 Encounter for supervision of normal first pregnancy, third trimester: Secondary | ICD-10-CM

## 2017-06-05 DIAGNOSIS — O99810 Abnormal glucose complicating pregnancy: Secondary | ICD-10-CM | POA: Diagnosis not present

## 2017-06-05 DIAGNOSIS — Z331 Pregnant state, incidental: Secondary | ICD-10-CM

## 2017-06-05 DIAGNOSIS — R81 Glycosuria: Secondary | ICD-10-CM

## 2017-06-05 LAB — GLUCOSE, POCT (MANUAL RESULT ENTRY): POC Glucose: 101 mg/dl — AB (ref 70–99)

## 2017-06-05 LAB — POCT URINALYSIS DIPSTICK
Blood, UA: NEGATIVE
Leukocytes, UA: NEGATIVE
Nitrite, UA: NEGATIVE

## 2017-06-05 NOTE — Patient Instructions (Addendum)
Angelica Ramirez, I greatly value your feedback.  If you receive a survey following your visit with [place] today, we appreciate you taking the time to fill it out.  Thanks, Joellyn Haff, CNM, WHNP-BC  You can try a 7 night over the counter yeast cream such as Monistat 7. It does not need to be brand name, generic is fine, but please make sure it is a 7 night cream.     Call the office [phone]) or go to [facility] if:  You begin to have strong, frequent contractions  Your water breaks.  Sometimes it is a big gush of fluid, sometimes it is just a trickle that keeps getting your panties wet or running down your legs  You have vaginal bleeding.  It is normal to have a small amount of spotting if your cervix was checked.   You don't feel your baby moving like normal.  If you don't, get you something to eat and drink and lay down and focus on feeling your baby move.  You should feel at least 10 movements in 2 hours.  If you don't, you should call the office or go to [facility].     [facility] Contractions Contractions of the uterus can occur throughout pregnancy, but they are not always a sign that you are in labor. You may have practice contractions called Braxton Hicks contractions. These false labor contractions are sometimes confused with true labor. What are Deberah Pelton contractions? Braxton Hicks contractions are tightening movements that occur in the muscles of the uterus before labor. Unlike true labor contractions, these contractions do not result in opening (dilation) and thinning of the cervix. Toward the end of pregnancy (32-34 weeks), Braxton Hicks contractions can happen more often and may become stronger. These contractions are sometimes difficult to tell apart from true labor because they can be very uncomfortable. You should not feel embarrassed if you go to the hospital with false labor. Sometimes, the only way to tell if you are in true labor is for your health care  provider to look for changes in the cervix. The health care provider will do a physical exam and may monitor your contractions. If you are not in true labor, the exam should show that your cervix is not dilating and your water has not broken. If there are other health problems associated with your pregnancy, it is completely safe for you to be sent home with false labor. You may continue to have Braxton Hicks contractions until you go into true labor. How to tell the difference between true labor and false labor True labor  Contractions last 30-70 seconds.  Contractions become very regular.  Discomfort is usually felt in the top of the uterus, and it spreads to the lower abdomen and low back.  Contractions do not go away with walking.  Contractions usually become more intense and increase in frequency.  The cervix dilates and gets thinner. False labor  Contractions are usually shorter and not as strong as true labor contractions.  Contractions are usually irregular.  Contractions are often felt in the front of the lower abdomen and in the groin.  Contractions may go away when you walk around or change positions while lying down.  Contractions get weaker and are shorter-lasting as time goes on.  The cervix usually does not dilate or become thin. Follow these instructions at home:  Take over-the-counter and prescription medicines only as told by your health care provider.  Keep up with your  usual exercises and follow other instructions from your health care provider.  Eat and drink lightly if you think you are going into labor.  If Braxton Hicks contractions are making you uncomfortable: ? Change your position from lying down or resting to walking, or change from walking to resting. ? Sit and rest in a tub of warm water. ? Drink enough fluid to keep your urine pale yellow. Dehydration may cause these contractions. ? Do slow and deep breathing several times an hour.  Keep all  follow-up prenatal visits as told by your health care provider. This is important. Contact a health care provider if:  You have a fever.  You have continuous pain in your abdomen. Get help right away if:  Your contractions become stronger, more regular, and closer together.  You have fluid leaking or gushing from your vagina.  You pass blood-tinged mucus (bloody show).  You have bleeding from your vagina.  You have low back pain that you never had before.  You feel your baby's head pushing down and causing pelvic pressure.  Your baby is not moving inside you as much as it used to. Summary  Contractions that occur before labor are called Braxton Hicks contractions, false labor, or practice contractions.  Braxton Hicks contractions are usually shorter, weaker, farther apart, and less regular than true labor contractions. True labor contractions usually become progressively stronger and regular and they become more frequent.  Manage discomfort from [facility] contractions by changing position, resting in a warm bath, drinking plenty of water, or practicing deep breathing. This information is not intended to replace advice given to you by your health care provider. Make sure you discuss any questions you have with your health care provider. Document Released: 08/16/2016 Document Revised: 08/16/2016 Document Reviewed: 08/16/2016 Elsevier Interactive Patient Education  2018 [employer]

## 2017-06-05 NOTE — Progress Notes (Signed)
   LOW-RISK PREGNANCY VISIT Patient name: Angelica Ramirez MRN [id number]  Date of birth: [birth year] Chief Complaint:   Routine Prenatal Visit  History of Present Illness:   Angelica Ramirez is a 22 y.o. 281P0000 female at 5969w5d with an Estimated Date of Delivery: 06/21/17 being seen today for ongoing management of a low-risk pregnancy. 2+ glucose in urine, CBG 101, ate raisin bran about 2 hours ago.  Today she reports no complaints. Contractions: Irregular. Vag. Bleeding: None.  Movement: Present. denies leaking of fluid. Review of Systems:   Pertinent items are noted in HPI Denies abnormal vaginal discharge w/ itching/odor/irritation, headaches, visual changes, shortness of breath, chest pain, abdominal pain, severe nausea/vomiting, or problems with urination or bowel movements unless otherwise stated above. Pertinent History Reviewed:  Reviewed past medical,surgical, social, obstetrical and family history.  Reviewed problem list, medications and allergies. Physical Assessment:   Vitals:   06/05/17 1218  BP: 112/60  Pulse: (!) 105  Weight: 223 lb (101.2 kg)  Body mass index is 35.99 kg/m.        Physical Examination:   General appearance: Well appearing, and in no distress  Mental status: Alert, oriented to person, place, and time  Skin: Warm & dry  Cardiovascular: Normal heart rate noted  Respiratory: Normal respiratory effort, no distress  Abdomen: Soft, gravid, nontender  Pelvic: Cervical exam performed  Dilation: Closed Effacement (%): Thick Station: -2  Extremities: Edema: Trace  Fetal Status: Fetal Heart Rate (bpm): 145 Fundal Height: 37 cm Movement: Present Presentation: Vertex  Results for orders placed or performed in visit on 06/05/17 (from the past 24 hour(s))  POCT urinalysis dipstick   Collection Time: 06/05/17 12:19 PM  Result Value Ref Range   Color, UA     Clarity, UA     Glucose, UA 2+    Bilirubin, UA     Ketones, UA trace    Spec Grav, UA  1.010 - 1.025   Blood, UA neg    pH, UA  5.0 - 8.0   Protein, UA trace    Urobilinogen, UA  0.2 or 1.0 E.U./dL   Nitrite, UA neg    Leukocytes, UA Negative Negative   Appearance     Odor    POCT glucose (manual entry)   Collection Time: 06/05/17 12:21 PM  Result Value Ref Range   POC Glucose 101 (A) 70 - 99 mg/dl    Assessment & Plan:  1) Low-risk pregnancy G1P0000 at 3569w5d with an Estimated Date of Delivery: 06/21/17   2) Hillery AldoGlusosuria w/ normal CBG  Meds: No orders of the defined types were placed in this encounter.  Labs/procedures today: sve per request  Plan:  Continue routine obstetrical care   Reviewed: GBS+,  Term labor symptoms and general obstetric precautions including but not limited to vaginal bleeding, contractions, leaking of fluid and fetal movement were reviewed in detail with the patient.  All questions were answered  Follow-up: Return in about 1 week (around 06/12/2017) for LROB.  Orders Placed This Encounter  Procedures  . POCT urinalysis dipstick  . POCT glucose (manual entry)   Cheral MarkerKimberly R Shantasia Hunnell CNM, [facility]NP-BC 06/05/2017 12:42 PM

## 2017-06-11 ENCOUNTER — Ambulatory Visit (INDEPENDENT_AMBULATORY_CARE_PROVIDER_SITE_OTHER): Payer: Medicaid Other | Admitting: Obstetrics & Gynecology

## 2017-06-11 ENCOUNTER — Encounter: Payer: Self-pay | Admitting: Obstetrics & Gynecology

## 2017-06-11 VITALS — BP 120/64 | HR 98 | Wt 223.0 lb

## 2017-06-11 DIAGNOSIS — O99810 Abnormal glucose complicating pregnancy: Secondary | ICD-10-CM

## 2017-06-11 DIAGNOSIS — Z1389 Encounter for screening for other disorder: Secondary | ICD-10-CM

## 2017-06-11 DIAGNOSIS — Z3403 Encounter for supervision of normal first pregnancy, third trimester: Secondary | ICD-10-CM

## 2017-06-11 DIAGNOSIS — Z3A38 38 weeks gestation of pregnancy: Secondary | ICD-10-CM | POA: Diagnosis not present

## 2017-06-11 DIAGNOSIS — R81 Glycosuria: Secondary | ICD-10-CM | POA: Diagnosis not present

## 2017-06-11 DIAGNOSIS — Z331 Pregnant state, incidental: Secondary | ICD-10-CM

## 2017-06-11 LAB — POCT URINALYSIS DIPSTICK
Blood, UA: NEGATIVE
Ketones, UA: NEGATIVE
Leukocytes, UA: NEGATIVE
Nitrite, UA: NEGATIVE

## 2017-06-11 LAB — GLUCOSE, POCT (MANUAL RESULT ENTRY): POC Glucose: 138 mg/dl — AB (ref 70–99)

## 2017-06-11 NOTE — Progress Notes (Signed)
G1P0000 5831w4d Estimated Date of Delivery: 06/21/17  Blood pressure 120/64, pulse 98, weight 223 lb (101.2 kg), last menstrual period 09/14/2016.   BP weight and urine results all reviewed and noted.  Please refer to the obstetrical flow sheet for the fundal height and fetal heart rate documentation:  Patient reports good fetal movement, denies any bleeding and no rupture of membranes symptoms or regular contractions. Patient is without complaints. All questions were answered.  Orders Placed This Encounter  Procedures  . POCT Urinalysis Dipstick  . POCT Glucose (CBG)    Plan:  Continued routine obstetrical care, pt had sweet tea for lunch  Return in about 1 week (around 06/18/2017) for LROB.

## 2017-06-18 ENCOUNTER — Ambulatory Visit (INDEPENDENT_AMBULATORY_CARE_PROVIDER_SITE_OTHER): Payer: Medicaid Other | Admitting: Obstetrics & Gynecology

## 2017-06-18 ENCOUNTER — Encounter: Payer: Self-pay | Admitting: Obstetrics & Gynecology

## 2017-06-18 VITALS — BP 118/60 | HR 89 | Wt 224.5 lb

## 2017-06-18 DIAGNOSIS — Z1389 Encounter for screening for other disorder: Secondary | ICD-10-CM

## 2017-06-18 DIAGNOSIS — O480 Post-term pregnancy: Secondary | ICD-10-CM

## 2017-06-18 DIAGNOSIS — Z331 Pregnant state, incidental: Secondary | ICD-10-CM

## 2017-06-18 DIAGNOSIS — Z3A39 39 weeks gestation of pregnancy: Secondary | ICD-10-CM | POA: Diagnosis not present

## 2017-06-18 DIAGNOSIS — Z3403 Encounter for supervision of normal first pregnancy, third trimester: Secondary | ICD-10-CM

## 2017-06-18 LAB — POCT URINALYSIS DIPSTICK
Blood, UA: NEGATIVE
Glucose, UA: NEGATIVE
Ketones, UA: NEGATIVE
Leukocytes, UA: NEGATIVE
Nitrite, UA: NEGATIVE

## 2017-06-18 NOTE — Progress Notes (Signed)
G1P0000 742w4d Estimated Date of Delivery: 06/21/17  Blood pressure 118/60, pulse 89, weight 224 lb 8 oz (101.8 kg), last menstrual period 09/14/2016.   BP weight and urine results all reviewed and noted.  Please refer to the obstetrical flow sheet for the fundal height and fetal heart rate documentation:  Patient reports good fetal movement, denies any bleeding and no rupture of membranes symptoms or regular contractions. Patient is without complaints. All questions were answered.  Orders Placed This Encounter  Procedures  . US Fetal BPP W/O Non Stress  . POCT Urinalysis Dipstick    Plan:  Continued routine obstetrical care, sonogram for fluid BPP 1 week If undelivered IOL at 41 weeks  Return in about 1 week (around 06/25/2017) for BPP/sono, LROB.

## 2017-06-19 ENCOUNTER — Inpatient Hospital Stay (HOSPITAL_COMMUNITY): Payer: Medicaid Other | Admitting: Anesthesiology

## 2017-06-19 ENCOUNTER — Encounter (HOSPITAL_COMMUNITY): Payer: Self-pay

## 2017-06-19 ENCOUNTER — Encounter: Payer: Medicaid Other | Admitting: Advanced Practice Midwife

## 2017-06-19 ENCOUNTER — Other Ambulatory Visit: Payer: Self-pay

## 2017-06-19 ENCOUNTER — Inpatient Hospital Stay (HOSPITAL_COMMUNITY)
Admission: AD | Admit: 2017-06-19 | Discharge: 2017-06-21 | DRG: 807 | Disposition: A | Discharge status: Home / Self Care | Payer: Medicaid Other | Source: Ambulatory Visit | Attending: Obstetrics & Gynecology | Admitting: Obstetrics & Gynecology

## 2017-06-19 DIAGNOSIS — Z3403 Encounter for supervision of normal first pregnancy, third trimester: Secondary | ICD-10-CM

## 2017-06-19 DIAGNOSIS — F1290 Cannabis use, unspecified, uncomplicated: Secondary | ICD-10-CM | POA: Diagnosis present

## 2017-06-19 DIAGNOSIS — O99324 Drug use complicating childbirth: Secondary | ICD-10-CM | POA: Diagnosis present

## 2017-06-19 DIAGNOSIS — O99824 Streptococcus B carrier state complicating childbirth: Secondary | ICD-10-CM | POA: Diagnosis present

## 2017-06-19 DIAGNOSIS — Z3A39 39 weeks gestation of pregnancy: Secondary | ICD-10-CM

## 2017-06-19 DIAGNOSIS — Z3483 Encounter for supervision of other normal pregnancy, third trimester: Secondary | ICD-10-CM | POA: Diagnosis present

## 2017-06-19 LAB — RAPID URINE DRUG SCREEN, HOSP PERFORMED
Amphetamines: NOT DETECTED
Barbiturates: NOT DETECTED
Benzodiazepines: NOT DETECTED
Cocaine: NOT DETECTED
Opiates: NOT DETECTED
Tetrahydrocannabinol: NOT DETECTED

## 2017-06-19 LAB — CBC
HCT: 35.3 % — ABNORMAL LOW (ref 36.0–46.0)
Hemoglobin: 12 g/dL (ref 12.0–15.0)
MCH: 27.7 pg (ref 26.0–34.0)
MCHC: 34 g/dL (ref 30.0–36.0)
MCV: 81.5 fL (ref 78.0–100.0)
Platelets: 215 10*3/uL (ref 150–400)
RBC: 4.33 MIL/uL (ref 3.87–5.11)
RDW: 12.8 % (ref 11.5–15.5)
WBC: 14.5 10*3/uL — ABNORMAL HIGH (ref 4.0–10.5)

## 2017-06-19 LAB — TYPE AND SCREEN
ABO/RH(D): O POS
Antibody Screen: NEGATIVE

## 2017-06-19 LAB — ABO/RH: ABO/RH(D): O POS

## 2017-06-19 MED ORDER — ONDANSETRON HCL 4 MG PO TABS
4.0000 mg | ORAL_TABLET | ORAL | Status: DC | PRN
Start: 2017-06-19 — End: 2017-06-21

## 2017-06-19 MED ORDER — BENZOCAINE-MENTHOL 20-0.5 % EX AERO
1.0000 "application " | INHALATION_SPRAY | CUTANEOUS | Status: DC | PRN
  Administered 2017-06-20: 1 via TOPICAL
  Filled 2017-06-19 (×2): qty 56

## 2017-06-19 MED ORDER — OXYTOCIN 40 UNITS IN LACTATED RINGERS INFUSION - SIMPLE MED: 1.0000 m[IU]/min | INTRAVENOUS | Status: DC

## 2017-06-19 MED ORDER — FENTANYL CITRATE (PF) 100 MCG/2ML IJ SOLN: 100.0000 ug | INTRAMUSCULAR | Status: DC | PRN

## 2017-06-19 MED ORDER — LACTATED RINGERS IV SOLN
500.0000 mL | Freq: Once | INTRAVENOUS | Status: AC
  Administered 2017-06-19: 500 mL via INTRAVENOUS

## 2017-06-19 MED ORDER — TETANUS-DIPHTH-ACELL PERTUSSIS 5-2.5-18.5 LF-MCG/0.5 IM SUSP: 0.5000 mL | Freq: Once | INTRAMUSCULAR | Status: DC

## 2017-06-19 MED ORDER — COCONUT OIL OIL
1.0000 | TOPICAL_OIL | Status: DC | PRN
Start: 2017-06-19 — End: 2017-06-21

## 2017-06-19 MED ORDER — OXYTOCIN 40 UNITS IN LACTATED RINGERS INFUSION - SIMPLE MED
2.5000 [IU]/h | INTRAVENOUS | Status: DC
  Filled 2017-06-19: qty 1000

## 2017-06-19 MED ORDER — PHENYLEPHRINE 40 MCG/ML (10ML) SYRINGE FOR IV PUSH (FOR BLOOD PRESSURE SUPPORT)
80.0000 ug | PREFILLED_SYRINGE | INTRAVENOUS | Status: DC | PRN
  Filled 2017-06-19: qty 10
  Filled 2017-06-19: qty 5

## 2017-06-19 MED ORDER — LACTATED RINGERS IV SOLN
500.0000 mL | INTRAVENOUS | Status: DC | PRN
  Administered 2017-06-19: 1000 mL via INTRAVENOUS
  Administered 2017-06-19: 500 mL via INTRAVENOUS

## 2017-06-19 MED ORDER — DIPHENHYDRAMINE HCL 50 MG/ML IJ SOLN: 12.5000 mg | INTRAMUSCULAR | Status: DC | PRN

## 2017-06-19 MED ORDER — SENNOSIDES-DOCUSATE SODIUM 8.6-50 MG PO TABS
2.0000 | ORAL_TABLET | ORAL | Status: DC
  Administered 2017-06-19 – 2017-06-21 (×2): 2 via ORAL
  Filled 2017-06-19 (×2): qty 2

## 2017-06-19 MED ORDER — ACETAMINOPHEN 325 MG PO TABS: 650.0000 mg | ORAL_TABLET | ORAL | Status: DC | PRN

## 2017-06-19 MED ORDER — ACETAMINOPHEN 325 MG PO TABS
650.0000 mg | ORAL_TABLET | ORAL | Status: DC | PRN
Start: 2017-06-19 — End: 2017-06-21

## 2017-06-19 MED ORDER — ONDANSETRON HCL 4 MG/2ML IJ SOLN: 4.0000 mg | INTRAMUSCULAR | Status: DC | PRN

## 2017-06-19 MED ORDER — EPHEDRINE 5 MG/ML INJ
10.0000 mg | INTRAVENOUS | Status: DC | PRN
  Filled 2017-06-19: qty 2

## 2017-06-19 MED ORDER — ZOLPIDEM TARTRATE 5 MG PO TABS: 5.0000 mg | ORAL_TABLET | Freq: Every evening | ORAL | Status: DC | PRN

## 2017-06-19 MED ORDER — LIDOCAINE HCL (PF) 1 % IJ SOLN
30.0000 mL | INTRAMUSCULAR | Status: DC | PRN
  Filled 2017-06-19: qty 30

## 2017-06-19 MED ORDER — OXYCODONE-ACETAMINOPHEN 5-325 MG PO TABS: 2.0000 | ORAL_TABLET | ORAL | Status: DC | PRN

## 2017-06-19 MED ORDER — PENICILLIN G POT IN DEXTROSE 60000 UNIT/ML IV SOLN
3.0000 10*6.[IU] | INTRAVENOUS | Status: DC
  Administered 2017-06-19 (×2): 3 10*6.[IU] via INTRAVENOUS
  Filled 2017-06-19 (×4): qty 50

## 2017-06-19 MED ORDER — SIMETHICONE 80 MG PO CHEW: 80.0000 mg | CHEWABLE_TABLET | ORAL | Status: DC | PRN

## 2017-06-19 MED ORDER — OXYTOCIN BOLUS FROM INFUSION
500.0000 mL | Freq: Once | INTRAVENOUS | Status: AC
  Administered 2017-06-19: 500 mL via INTRAVENOUS

## 2017-06-19 MED ORDER — WITCH HAZEL-GLYCERIN EX PADS: 1.0000 "application " | MEDICATED_PAD | CUTANEOUS | Status: DC | PRN

## 2017-06-19 MED ORDER — SOD CITRATE-CITRIC ACID 500-334 MG/5ML PO SOLN: 30.0000 mL | ORAL | Status: DC | PRN

## 2017-06-19 MED ORDER — IBUPROFEN 600 MG PO TABS
600.0000 mg | ORAL_TABLET | Freq: Four times a day (QID) | ORAL | Status: DC
  Administered 2017-06-19 – 2017-06-21 (×7): 600 mg via ORAL
  Filled 2017-06-19 (×7): qty 1

## 2017-06-19 MED ORDER — LACTATED RINGERS IV SOLN
INTRAVENOUS | Status: DC
  Administered 2017-06-19 (×3): via INTRAVENOUS

## 2017-06-19 MED ORDER — PHENYLEPHRINE 40 MCG/ML (10ML) SYRINGE FOR IV PUSH (FOR BLOOD PRESSURE SUPPORT)
80.0000 ug | PREFILLED_SYRINGE | INTRAVENOUS | Status: DC | PRN
  Administered 2017-06-19: 80 ug via INTRAVENOUS
  Filled 2017-06-19: qty 5

## 2017-06-19 MED ORDER — DIBUCAINE 1 % RE OINT: 1.0000 "application " | TOPICAL_OINTMENT | RECTAL | Status: DC | PRN

## 2017-06-19 MED ORDER — DIPHENHYDRAMINE HCL 25 MG PO CAPS: 25.0000 mg | ORAL_CAPSULE | Freq: Four times a day (QID) | ORAL | Status: DC | PRN

## 2017-06-19 MED ORDER — FENTANYL 2.5 MCG/ML BUPIVACAINE 1/10 % EPIDURAL INFUSION (WH - ANES)
14.0000 mL/h | INTRAMUSCULAR | Status: DC | PRN
  Administered 2017-06-19 (×2): 14 mL/h via EPIDURAL
  Filled 2017-06-19 (×2): qty 100

## 2017-06-19 MED ORDER — OXYCODONE-ACETAMINOPHEN 5-325 MG PO TABS: 1.0000 | ORAL_TABLET | ORAL | Status: DC | PRN

## 2017-06-19 MED ORDER — PRENATAL MULTIVITAMIN CH
1.0000 | ORAL_TABLET | Freq: Every day | ORAL | Status: DC
  Administered 2017-06-20: 1 via ORAL
  Filled 2017-06-19: qty 1

## 2017-06-19 MED ORDER — SODIUM CHLORIDE 0.9 % IV SOLN
5.0000 10*6.[IU] | Freq: Once | INTRAVENOUS | Status: AC
  Administered 2017-06-19: 5 10*6.[IU] via INTRAVENOUS
  Filled 2017-06-19: qty 5

## 2017-06-19 MED ORDER — TERBUTALINE SULFATE 1 MG/ML IJ SOLN
0.2500 mg | Freq: Once | INTRAMUSCULAR | Status: DC | PRN
  Filled 2017-06-19: qty 1

## 2017-06-19 MED ORDER — ONDANSETRON HCL 4 MG/2ML IJ SOLN
4.0000 mg | Freq: Four times a day (QID) | INTRAMUSCULAR | Status: DC | PRN
  Administered 2017-06-19: 4 mg via INTRAVENOUS
  Filled 2017-06-19: qty 2

## 2017-06-19 MED ORDER — LIDOCAINE HCL (PF) 1 % IJ SOLN
INTRAMUSCULAR | Status: DC | PRN
  Administered 2017-06-19: 5 mL via EPIDURAL
  Administered 2017-06-19: 7 mL via EPIDURAL

## 2017-06-19 MED ORDER — OXYTOCIN 40 UNITS IN LACTATED RINGERS INFUSION - SIMPLE MED
1.0000 m[IU]/min | INTRAVENOUS | Status: DC
  Administered 2017-06-19: 2 m[IU]/min via INTRAVENOUS

## 2017-06-19 NOTE — H&P (Addendum)
Obstetric History and Physical  Angelica Ramirez is a 22 y.o. G1P0000 with IUP at 9143w5d presenting for SOL. Patient states she has been having  regular, every 3 minutes contractions,intact membranes, with active fetal movement.    Prenatal Course Source of Care: [facility]  with onset of care at 9 weeks Dating: By LMP c/w US 7wk --->  Estimated Date of Delivery: 06/21/17 Pregnancy complications or risks: Patient Active Problem List   Diagnosis Date Noted  . Marijuana use 12/07/2016  . Supervision of normal first pregnancy 11/22/2016  . INFECTIOUS MONONUCLEOSIS 12/08/2007   She plans to bottle feed She desires planning oral contraceptives (estrogen/progesterone) for postpartum contraception.   Sono:   @[redacted]w[redacted]d , CWD, normal anatomy, cephalic presentation, posterior placenta  Prenatal labs and studies: ABO, Rh: O/Positive/-- (08/09 1059) Antibody: Negative (12/12 0924) Rubella: 1.17 (08/09 1059) RPR: Non Reactive (12/12 0924)  HBsAg: Negative (08/09 1059)  HIV: Non Reactive (12/12 0924)  ZOX:WRUEAVWUGBS:Positive (02/14 1645) 2hr GTT: normal Genetic screening normal, 1st trimester screen Anatomy US normal  Prenatal Transfer Tool  Maternal Diabetes: No Genetic Screening: Normal Maternal Ultrasounds/Referrals: Normal Fetal Ultrasounds or other Referrals:  None Maternal Substance Abuse:  Yes:  Type: Marijuana Significant Maternal Medications:  None Significant Maternal Lab Results: Lab values include: Group B Strep positive  Past Medical History:  Diagnosis Date  . Medical history non-contributory     Past Surgical History:  Procedure Laterality Date  . NO PAST SURGERIES      OB History  Gravida Para Term Preterm AB Living  1 0 0 0 0 0  SAB TAB Ectopic Multiple Live Births  0 0 0 0      # Outcome Date GA Lbr Len/2nd Weight Sex Delivery Anes PTL Lv  1 Current               Social History   Socioeconomic History  . Marital status: Single    Spouse name: None  . Number of  children: None  . Years of education: None  . Highest education level: None  Social Needs  . Financial resource strain: None  . Food insecurity - worry: None  . Food insecurity - inability: None  . Transportation needs - medical: None  . Transportation needs - non-medical: None  Occupational History  . None  Tobacco Use  . Smoking status: Never Smoker  . Smokeless tobacco: Never Used  Substance and Sexual Activity  . Alcohol use: No    Alcohol/week: 0.0 oz  . Drug use: No    Comment: last used June 2018  . Sexual activity: Yes    Birth control/protection: None  Other Topics Concern  . None  Social History Narrative  . None    Family History  Problem Relation Age of Onset  . Diabetes Paternal Grandmother   . Diabetes Maternal Grandmother     Medications Prior to Admission  Medication Sig Dispense Refill Last Dose  . flintstones complete (FLINTSTONES) 60 MG chewable tablet Chew 1 tablet by mouth daily. (Patient taking differently: Chew 2 tablets by mouth daily. )   Taking    No Known Allergies  Review of Systems: Negative except for what is mentioned in HPI.  Physical Exam: BP 124/73   Pulse (!) 103   Temp 97.9 F (36.6 C)   Resp 17   Ht 5\' 6"  (1.676 m)   Wt 102.2 kg (225 lb 4 oz)   LMP 09/14/2016 (Approximate)   SpO2 99%   BMI 36.36 kg/m  CONSTITUTIONAL: Well-developed, well-nourished female in no acute distress.  HENT:  Normocephalic, atraumatic, External right and left ear normal. Oropharynx is clear and moist EYES: Conjunctivae and EOM are normal. Pupils are equal, round, and reactive to light. No scleral icterus.  NECK: Normal range of motion, supple, no masses SKIN: Skin is warm and dry. No rash noted. Not diaphoretic. No erythema. No pallor. NEUROLOGIC: Alert and oriented to person, place, and time. Normal reflexes, muscle tone coordination. No cranial nerve deficit noted. PSYCHIATRIC: Normal mood and affect. Normal behavior. Normal judgment and  thought content. CARDIOVASCULAR: Normal heart rate noted, regular rhythm RESPIRATORY: Effort and breath sounds normal, no problems with respiration noted ABDOMEN: Soft, nontender, nondistended, gravid. MUSCULOSKELETAL: Normal range of motion. No edema and no tenderness. 2+ distal pulses.  Cervical Exam: Dilatation 4cm   Effacement 90%   Station -2 by RN exam Presentation: cephalic by RN SVE FHT:  Baseline rate 130 bpm   Variability moderate  Accelerations present   Decelerations variable ( Contractions: Every 3 mins   Pertinent Labs/Studies:   Results for orders placed or performed in visit on 06/18/17 (from the past 24 hour(s))  POCT Urinalysis Dipstick     Status: None   Collection Time: 06/18/17  4:30 PM  Result Value Ref Range   Color, UA     Clarity, UA     Glucose, UA neg    Bilirubin, UA     Ketones, UA neg    Spec Grav, UA  1.010 - 1.025   Blood, UA neg    pH, UA  5.0 - 8.0   Protein, UA trace    Urobilinogen, UA  0.2 or 1.0 E.U./dL   Nitrite, UA neg    Leukocytes, UA Negative Negative   Appearance     Odor      Assessment : Angelica Ramirez is a 22 y.o. G1P0000 at [redacted]w[redacted]d being admitted for active labor.   Plan: Labor: Expectant management Epidural FWB: Category II  GBS positive: Penicillin  Delivery plan: Hopeful for vaginal delivery   Angelica Ramirez  MS3   OB FELLOW MEDICAL STUDENT NOTE ATTESTATION I confirm that I have verified the information documented in the medical student's note and that I have also personally performed the physical exam and all medical decision making activities.  Pt admitted for SOL.  GBS pos, PCN started UDS for hx of MJ use  Angelica Pear, MD OB Fellow 06/19/2017, 11:38 AM

## 2017-06-19 NOTE — Progress Notes (Signed)
Brief Progress Note  Pt with reactive FHT prior to epidural (moderate variability, +acel, rare brief variable decel). Shortly after getting epidural, FHT with decel down to the 80s. BP 90s/30s. Patient repositioned, placed on supplemental O2. AROM (clear fluid), and FSE and IUPC placed.   Current FHT baseline 150, decreased variability (~5), subtle late decels) 1 L IVF bolus started.   Will continue to monitor closely.  Raynelle FanningJulie P. Degele, MD OB Fellow

## 2017-06-19 NOTE — Anesthesia Pain Management Evaluation Note (Signed)
  CRNA Pain Management Visit Note  Patient: Angelica Ramirez, 22 y.o., female  "Hello [facility] am a [facility]. We have an anesthesia team available at all times to provide care throughout the hospital, including epidural management and anesthesia for C-section. I don't know your plan for the delivery whether it a natural birth, water birth, IV sedation, nitrous supplementation, doula or epidural, but we want to meet your pain goals."   1.Was your pain managed to your expectations on prior hospitalizations?   No prior hospitalizations  2.What is your expectation for pain management during this hospitalization?     Epidural  3.How can we help you reach that goal?   Record the patient's initial score and the patient's pain goal.   Pain: 8  Pain Goal: 5 [facility] wants you to be able to say your pain was always managed very well.  [facility]MARSHALL,[name] 06/19/2017

## 2017-06-19 NOTE — Progress Notes (Signed)
LABOR PROGRESS NOTE  Wynell BalloonMalia A Savoia is a 22 y.o. G1P0000 at 2859w5d  admitted for SOL  Subjective: Patient breathing through contractions, feeling constant pressure in bottom after laboring down for two hours.   Objective: BP (!) 150/116 Comment: patient pushing   Pulse (!) 103   Temp 97.7 F (36.5 C) (Oral)   Resp 16   Ht 5\' 6"  (1.676 m)   Wt 225 lb 4 oz (102.2 kg)   LMP 09/14/2016 (Approximate)   SpO2 96%   BMI 36.36 kg/m  or  Vitals:   06/19/17 1600 06/19/17 1630 06/19/17 1700 06/19/17 1730  BP: 110/78 (!) 143/71 122/86 (!) 150/116  Pulse: (!) 138 (!) 108 (!) 138 (!) 103  Resp: 18 16 16 16   Temp:      TempSrc:      SpO2:      Weight:      Height:        Restarted pushing @ 1730 Dilation: 10 Dilation Complete Date: 06/19/17 Dilation Complete Time: 1345 Effacement (%): 100 Cervical Position: Anterior Station: Plus 1 Presentation: Vertex Exam by:: Tennis MustS. Russell, RN  FHT: baseline rate 140, minimal/moderate varibility, +acel, variable and occasional decel Toco: 3-5/ moderate by palpation   Labs: Lab Results  Component Value Date   WBC 14.5 (H) 06/19/2017   HGB 12.0 06/19/2017   HCT 35.3 (L) 06/19/2017   MCV 81.5 06/19/2017   PLT 215 06/19/2017    Patient Active Problem List   Diagnosis Date Noted  . Normal labor 06/19/2017  . Marijuana use 12/07/2016  . Supervision of normal first pregnancy 11/22/2016    Assessment / Plan: 22 y.o. G1P0000 at 1959w5d here for SOL  Labor: Progressing well, urge to push, starting push Fetal Wellbeing:  Cat II Pain Control:  Epidural Anticipated MOD:  SVD  Sharyon CableRogers, Albert Devaul C, CNM 06/19/2017, 7:19 PM

## 2017-06-19 NOTE — Anesthesia Procedure Notes (Signed)
Epidural Patient location during procedure: OB Start time: 06/19/2017 10:47 AM End time: 06/19/2017 10:50 AM  Staffing Anesthesiologist: Leilani AbleHatchett, Cieanna Stormes, MD Performed: anesthesiologist   Preanesthetic Checklist Completed: patient identified, site marked, surgical consent, pre-op evaluation, timeout performed, IV checked, risks and benefits discussed and monitors and equipment checked  Epidural Patient position: sitting Prep: site prepped and draped and DuraPrep Patient monitoring: continuous pulse ox and blood pressure Approach: midline Location: L3-L4 Injection technique: LOR air  Needle:  Needle type: Tuohy  Needle gauge: 17 G Needle length: 9 cm and 9 Needle insertion depth: 8 cm Catheter type: closed end flexible Catheter size: 19 Gauge Catheter at skin depth: 14 cm Test dose: negative and Other  Assessment Sensory level: T9 Events: blood not aspirated, injection not painful, no injection resistance, negative IV test and no paresthesia

## 2017-06-19 NOTE — Anesthesia Preprocedure Evaluation (Signed)
Anesthesia Evaluation  Patient identified by MRN, date of birth, ID band Patient awake    Reviewed: Allergy & Precautions, H&P , NPO status , Patient's Chart, lab work & pertinent test results  Airway Mallampati: II  TM Distance: >3 FB Neck ROM: full    Dental no notable dental hx. (+) Teeth Intact   Pulmonary neg pulmonary ROS,    Pulmonary exam normal breath sounds clear to auscultation       Cardiovascular negative cardio ROS Normal cardiovascular exam Rhythm:regular Rate:Normal     Neuro/Psych negative neurological ROS  negative psych ROS   GI/Hepatic negative GI ROS, Neg liver ROS,   Endo/Other  negative endocrine ROS  Renal/GU negative Renal ROS  negative genitourinary   Musculoskeletal negative musculoskeletal ROS (+)   Abdominal (+) + obese,   Peds  Hematology negative hematology ROS (+)   Anesthesia Other Findings   Reproductive/Obstetrics (+) Pregnancy                             Anesthesia Physical Anesthesia Plan  ASA: II  Anesthesia Plan: Epidural   Post-op Pain Management:    Induction:   PONV Risk Score and Plan:   Airway Management Planned:   Additional Equipment:   Intra-op Plan:   Post-operative Plan:   Informed Consent: I have reviewed the patients History and Physical, chart, labs and discussed the procedure including the risks, benefits and alternatives for the proposed anesthesia with the patient or authorized representative who has indicated his/her understanding and acceptance.       Plan Discussed with:   Anesthesia Plan Comments:         Anesthesia Quick Evaluation  

## 2017-06-19 NOTE — MAU Note (Signed)
CTX since 0130, now every 3-4 mins.  No LOF/VB.  Reports good FM.  Cervix checked yesterday, not dilated.

## 2017-06-20 ENCOUNTER — Telehealth: Payer: Self-pay | Admitting: *Deleted

## 2017-06-20 LAB — RPR: RPR Ser Ql: NONREACTIVE

## 2017-06-20 NOTE — Anesthesia Postprocedure Evaluation (Signed)
Anesthesia Post Note  Patient: Angelica BalloonMalia A Gurka  Procedure(s) Performed: AN AD HOC LABOR EPIDURAL     Patient location during evaluation: Mother Baby Anesthesia Type: Epidural Level of consciousness: awake Pain management: satisfactory to patient Vital Signs Assessment: post-procedure vital signs reviewed and stable Respiratory status: spontaneous breathing Cardiovascular status: stable Anesthetic complications: no    Last Vitals:  Vitals:   06/19/17 2341 06/20/17 0418  BP: 128/64 (!) 103/52  Pulse: (!) 105 97  Resp:  18  Temp: 36.9 C 36.9 C  SpO2:      Last Pain:  Vitals:   06/20/17 0735  TempSrc:   PainSc: 0-No pain   Pain Goal:                 KeyCorpBURGER,Ishaq Maffei

## 2017-06-20 NOTE — Telephone Encounter (Signed)
Called pt to set up pp appointment but unable to reach patient.  Left voicemail.  06-20-17  AS

## 2017-06-20 NOTE — Progress Notes (Signed)
POSTPARTUM PROGRESS NOTE  Post Partum Day 1 Subjective:  Wynell BalloonMalia A Duffey is a 22 y.o. G1P1001 9315w5d s/p SVD.  No acute events overnight.  Pt denies problems with ambulating, voiding or po intake. Pain is well controlled on Ibuprofen. Lochia Minimal.   Objective: Blood pressure (!) 103/52, pulse 97, temperature 98.4 F (36.9 C), temperature source Oral, resp. rate 18, height 5\' 6"  (1.676 m), weight 102.2 kg (225 lb 4 oz), last menstrual period 09/14/2016, SpO2 96 %, unknown if currently breastfeeding.  Physical Exam:  General: alert, cooperative and no distress Lochia:normal flow Chest: no respiratory distress Heart:regular rate, distal pulses intact Abdomen: soft, nontender,  Uterine Fundus: firm, appropriately tender DVT Evaluation: No calf swelling or tenderness Extremities: no edema  Recent Labs    06/19/17 0940  HGB 12.0  HCT 35.3*    Assessment/Plan:  ASSESSMENT: Wynell BalloonMalia A Winterhalter is a 22 y.o. G1P1001 7515w5d s/p SVD PPD#1.   MOF: Bottle MOC: ?OCPs Plan for discharge tomorrow   LOS: 1 day   Lillette BoxerStephanie R Alaysiah Browder 06/20/2017, 7:31 AM

## 2017-06-21 MED ORDER — IBUPROFEN 600 MG PO TABS: 600.0000 mg | ORAL_TABLET | Freq: Four times a day (QID) | ORAL | 0 refills | Status: DC

## 2017-06-21 NOTE — Discharge Instructions (Signed)
Postpartum Care After Vaginal Delivery °The period of time right after you deliver your newborn is called the postpartum period. °What kind of medical care will I receive? °· You may continue to receive fluids and medicines through an IV tube inserted into one of your veins. °· If an incision was made near your vagina (episiotomy) or if you had some vaginal tearing during delivery, cold compresses may be placed on your episiotomy or your tear. This helps to reduce pain and swelling. °· You may be given a squirt bottle to use when you go to the bathroom. You may use this until you are comfortable wiping as usual. To use the squirt bottle, follow these steps: °? Before you urinate, fill the squirt bottle with warm water. Do not use hot water. °? After you urinate, while you are sitting on the toilet, use the squirt bottle to rinse the area around your urethra and vaginal opening. This rinses away any urine and blood. °? You may do this instead of wiping. As you start healing, you may use the squirt bottle before wiping yourself. Make sure to wipe gently. °? Fill the squirt bottle with clean water every time you use the bathroom. °· You will be given sanitary pads to wear. °How can I expect to feel? °· You may not feel the need to urinate for several hours after delivery. °· You will have some soreness and pain in your abdomen and vagina. °· If you are breastfeeding, you may have uterine contractions every time you breastfeed for up to several weeks postpartum. Uterine contractions help your uterus return to its normal size. °· It is normal to have vaginal bleeding (lochia) after delivery. The amount and appearance of lochia is often similar to a menstrual period in the first week after delivery. It will gradually decrease over the next few weeks to a dry, yellow-brown discharge. For most women, lochia stops completely by 6-8 weeks after delivery. Vaginal bleeding can vary from woman to woman. °· Within the first few  days after delivery, you may have breast engorgement. This is when your breasts feel heavy, full, and uncomfortable. Your breasts may also throb and feel hard, tightly stretched, warm, and tender. After this occurs, you may have milk leaking from your breasts. Your health care provider can help you relieve discomfort due to breast engorgement. Breast engorgement should go away within a few days. °· You may feel more sad or worried than normal due to hormonal changes after delivery. These feelings should not last more than a few days. If these feelings do not go away after several days, speak with your health care provider. °How should I care for myself? °· Tell your health care provider if you have pain or discomfort. °· Drink enough water to keep your urine clear or pale yellow. °· Wash your hands thoroughly with soap and water for at least 20 seconds after changing your sanitary pads, after using the toilet, and before holding or feeding your baby. °· If you are not breastfeeding, avoid touching your breasts a lot. Doing this can make your breasts produce more milk. °· If you become weak or lightheaded, or you feel like you might faint, ask for help before: °? Getting out of bed. °? Showering. °· Change your sanitary pads frequently. Watch for any changes in your flow, such as a sudden increase in volume, a change in color, the passing of large blood clots. If you pass a blood clot from your vagina, save it   to show to your health care provider. Do not flush blood clots down the toilet without having your health care provider look at them. °· Make sure that all your vaccinations are up to date. This can help protect you and your baby from getting certain diseases. You may need to have immunizations done before you leave the hospital. °· If desired, talk with your health care provider about methods of family planning or birth control (contraception). °How can I start bonding with my baby? °Spending as much time as  possible with your baby is very important. During this time, you and your baby can get to know each other and develop a bond. Having your baby stay with you in your room (rooming in) can give you time to get to know your baby. Rooming in can also help you become comfortable caring for your baby. Breastfeeding can also help you bond with your baby. °How can I plan for returning home with my baby? °· Make sure that you have a car seat installed in your vehicle. °? Your car seat should be checked by a certified car seat installer to make sure that it is installed safely. °? Make sure that your baby fits into the car seat safely. °· Ask your health care provider any questions you have about caring for yourself or your baby. Make sure that you are able to contact your health care provider with any questions after leaving the hospital. °This information is not intended to replace advice given to you by your health care provider. Make sure you discuss any questions you have with your health care provider. °Document Released: 01/28/2007 Document Revised: 09/05/2015 Document Reviewed: 03/07/2015 °Elsevier Interactive Patient Education © 2018 [employer] ° °

## 2017-06-21 NOTE — Progress Notes (Signed)
CSW received consult for hx of marijuana use.  Referral was screened out due to the following: °~MOB had no documented substance use after initial prenatal visit/+UPT. °~MOB had no positive drug screens after initial prenatal visit/+UPT. °~Baby's UDS is negative. ° °Please consult CSW if current concerns arise or by MOB's request. ° °CSW will monitor CDS results and make report to Child Protective Services if warranted. ° °Gicela Schwarting Boyd-Gilyard, MSW, LCSW °Clinical Social Work °[phone] ° °

## 2017-06-21 NOTE — Discharge Summary (Signed)
OB Discharge Summary     Patient Name: Angelica BalloonMalia A Ramirez DOB: Aug 04, 1995 MRN: [id number]  Date of admission: 06/19/2017 Delivering MD: Pincus LargePHELPS, JAZMA Y   Date of discharge: 06/21/2017  Admitting diagnosis: 39WKS CTX 3-4MINS Intrauterine pregnancy: 4775w5d     Secondary diagnosis:  Active Problems:   Normal labor   SVD (spontaneous vaginal delivery)  Additional problems: none      Discharge diagnosis: Term Pregnancy Delivered                                                                                                Post partum procedures:  Augmentation: AROM and Pitocin  Complications: None  Hospital course:  Onset of Labor With Vaginal Delivery     22 y.o. yo G1P1001 at 975w5d was admitted in Latent Labor on 06/19/2017. Patient had an uncomplicated labor course as follows:  Membrane Rupture Time/Date: 11:10 AM ,06/19/2017   Intrapartum Procedures: Episiotomy: None [1]                                         Lacerations:  Periurethral [8]  Patient had a delivery of a Viable infant. 06/19/2017  Information for the patient's newborn:  Donita BrooksWatkins, Girl Kenyana [[id number]][[id number]]  Delivery Method: Vag-Spont    Pateint had an uncomplicated postpartum course.  She is ambulating, tolerating a regular diet, passing flatus, and urinating well. Patient is discharged home in stable condition on 06/21/17.   Physical exam  Vitals:   06/20/17 0418 06/20/17 1026 06/20/17 1816 06/20/17 1943  BP: (!) 103/52 111/72 110/64 118/76  Pulse: 97 98 90 89  Resp: 18 18 18 18   Temp: 98.4 F (36.9 C) 98.1 F (36.7 C) 98.8 F (37.1 C) 98.1 F (36.7 C)  TempSrc: Oral Oral Oral Oral  SpO2:      Weight:      Height:       General: alert, cooperative and no distress Lochia: appropriate Uterine Fundus: firm Incision: N/A DVT Evaluation: No evidence of DVT seen on physical exam. Negative Homan's sign. No cords or calf tenderness. Labs: Lab Results  Component Value Date   WBC 14.5 (H) 06/19/2017   HGB 12.0  06/19/2017   HCT 35.3 (L) 06/19/2017   MCV 81.5 06/19/2017   PLT 215 06/19/2017   CMP Latest Ref Rng & Units 10/29/2016  Glucose 65 - 99 mg/dL 92  BUN 6 - 20 mg/dL 7  Creatinine 8.410.44 - 3.241.00 mg/dL 4.010.66  Sodium 027135 - 253145 mmol/L 137  Potassium 3.5 - 5.1 mmol/L 3.6  Chloride 101 - 111 mmol/L 103  CO2 22 - 32 mmol/L 25  Calcium 8.9 - 10.3 mg/dL 9.5  Total Protein 6.5 - 8.1 g/dL 7.8  Total Bilirubin 0.3 - 1.2 mg/dL 1.2  Alkaline Phos 38 - 126 U/L 46  AST 15 - 41 U/L 17  ALT 14 - 54 U/L 10(L)    Discharge instruction: per After Visit Summary and "Baby and Me Booklet".  After visit meds:  Allergies as  of 06/21/2017   No Known Allergies     Medication List    STOP taking these medications   flintstones complete 60 MG chewable tablet     TAKE these medications   ibuprofen 600 MG tablet Commonly known as:  ADVIL,MOTRIN Take 1 tablet (600 mg total) by mouth every 6 (six) hours.       Diet: Term Pregnancy Delivered  Activity: Advance as tolerated. Pelvic rest for 6 weeks.   Outpatient follow up:4 weeks Follow up Appt: Future Appointments  Date Time Provider Department Center  06/27/2017 10:30 AM [facility] [place] [facility] None  06/27/2017 11:15 AM Cheral Marker, CNM [facility]   Follow up Visit:No Follow-up on file.  Postpartum contraception: Combination OCPs  Newborn Data: Live born female  Birth Weight: 7 lb 8.1 oz (3405 g) APGAR: 8, 9  Newborn Delivery   Birth date/time:  06/19/2017 20:53:00 Delivery type:  Vaginal, Spontaneous     Baby Feeding: Bottle Disposition:home with mother   06/21/2017 Jacklyn Shell, CNM

## 2017-06-27 ENCOUNTER — Encounter: Payer: Medicaid Other | Admitting: Women's Health

## 2017-06-27 ENCOUNTER — Other Ambulatory Visit: Payer: Medicaid Other

## 2017-07-18 ENCOUNTER — Telehealth: Payer: Self-pay | Admitting: *Deleted

## 2017-07-18 NOTE — Telephone Encounter (Signed)
Patient states she would like to return to work next Thursday.  Informed patient she has appointment with Drenda FreezeFran on 4/9 and could get note then. Patient states she thought her appointment was on 4/16.  Is fine to get note then.

## 2017-07-23 ENCOUNTER — Ambulatory Visit (INDEPENDENT_AMBULATORY_CARE_PROVIDER_SITE_OTHER): Payer: Medicaid Other | Admitting: Advanced Practice Midwife

## 2017-07-23 ENCOUNTER — Other Ambulatory Visit: Payer: Self-pay

## 2017-07-23 ENCOUNTER — Encounter: Payer: Self-pay | Admitting: Advanced Practice Midwife

## 2017-07-23 VITALS — BP 122/74 | HR 88 | Ht 66.0 in | Wt 198.0 lb

## 2017-07-23 DIAGNOSIS — Z3202 Encounter for pregnancy test, result negative: Secondary | ICD-10-CM

## 2017-07-23 LAB — POCT URINE PREGNANCY: Preg Test, Ur: NEGATIVE

## 2017-07-23 MED ORDER — NORETHIN-ETH ESTRAD-FE BIPHAS 1 MG-10 MCG / 10 MCG PO TABS: 1.0000 | ORAL_TABLET | Freq: Every day | ORAL | 11 refills | Status: DC

## 2017-07-23 NOTE — Progress Notes (Signed)
Angelica BalloonMalia A Ramirez is a 22 y.o. who presents for a postpartum visit. She is 4 weeks postpartum following a spontaneous vaginal delivery. I have fully reviewed the prenatal and intrapartum course. The delivery was at 39.5 gestational weeks.  Anesthesia: epidural. Postpartum course has been uneventful. Baby's course has been uneventful. Baby is feeding by bottle. Bleeding: no bleeding. Bowel function is normal. Bladder function is normal. Patient is sexually active. Contraception method is condoms. Postpartum depression screening: negative.   Current Outpatient Medications:  .  ibuprofen (ADVIL,MOTRIN) 600 MG tablet, Take 1 tablet (600 mg total) by mouth every 6 (six) hours. (Patient not taking: Reported on 07/23/2017), Disp: 30 tablet, Rfl: 0  Review of Systems   Constitutional: Negative for fever and chills Eyes: Negative for visual disturbances Respiratory: Negative for shortness of breath, dyspnea Cardiovascular: Negative for chest pain or palpitations  Gastrointestinal: Negative for vomiting, diarrhea and constipation Genitourinary: Negative for dysuria and urgency Musculoskeletal: Negative for back pain, joint pain, myalgias  Neurological: Negative for dizziness and headaches    Objective:     Vitals:   07/23/17 1001  BP: 122/74  Pulse: 88   General:  alert, cooperative and no distress   Breasts:  negative  Lungs: clear to auscultation bilaterally  Heart:  regular rate and rhythm  Abdomen: Soft, nontender   Vulva:  normal  Vagina: normal vagina  Cervix:  closed  Corpus: Well involuted     Rectal Exam: no hemorrhoids        Assessment:    normal postpartum exam.  Plan:   1. Contraception: OCP (estrogen/progesterone) (start 4/17)  2. Follow up in: 3 months or as needed.

## 2017-10-22 ENCOUNTER — Ambulatory Visit: Payer: Medicaid Other | Admitting: Advanced Practice Midwife

## 2017-10-23 ENCOUNTER — Ambulatory Visit: Payer: Medicaid Other | Admitting: Advanced Practice Midwife

## 2018-01-01 ENCOUNTER — Ambulatory Visit: Payer: Medicaid Other | Admitting: Women's Health

## 2018-07-12 ENCOUNTER — Other Ambulatory Visit: Payer: Self-pay

## 2018-07-12 ENCOUNTER — Emergency Department (HOSPITAL_COMMUNITY)
Admission: EM | Admit: 2018-07-12 | Discharge: 2018-07-12 | Disposition: A | Discharge status: Home / Self Care | Payer: Medicaid Other | Attending: Emergency Medicine | Admitting: Emergency Medicine

## 2018-07-12 ENCOUNTER — Encounter (HOSPITAL_COMMUNITY): Payer: Self-pay | Admitting: Emergency Medicine

## 2018-07-12 DIAGNOSIS — J069 Acute upper respiratory infection, unspecified: Secondary | ICD-10-CM | POA: Diagnosis not present

## 2018-07-12 DIAGNOSIS — B9789 Other viral agents as the cause of diseases classified elsewhere: Secondary | ICD-10-CM

## 2018-07-12 DIAGNOSIS — R05 Cough: Secondary | ICD-10-CM | POA: Diagnosis not present

## 2018-07-12 DIAGNOSIS — Z209 Contact with and (suspected) exposure to unspecified communicable disease: Secondary | ICD-10-CM | POA: Insufficient documentation

## 2018-07-12 NOTE — ED Triage Notes (Signed)
Patient c/o congested cough with sinus pressure and sore throat that started on Thursday and is progressively getting worse. Patient unsure of fever. Per patient cough productive with thick green sputum.

## 2018-07-12 NOTE — Discharge Instructions (Addendum)
Your vital signs are stable at this time.  Your oxygen level is 100% on room air.  Your history and examination are consistent with viral upper respiratory infection with cough.  The current recommendations for the Center for disease control is for self quarantine over the next 10 to 14 days.  Please use your mask until symptoms have resolved.  Please wash hands frequently.  Wipe off surfaces as needed.  Maintain good hydration.  Use Tylenol every 4 hours or ibuprofen every 6 hours for fever, and/or aching.  Please see your primary physician or return to the emergency department if any unusual shortness of breath or difficulty breathing.  Return if any temperature elevations that will not respond to Tylenol or ibuprofen, worsening of your symptoms, changes in your condition, problems or concerns.

## 2018-07-12 NOTE — ED Provider Notes (Signed)
[facility] EMERGENCY DEPARTMENT Provider Note   CSN: [id number] Arrival date & time: 07/12/18  1244    History   Chief Complaint Chief Complaint  Patient presents with  . Cough    HPI Angelica Ramirez is a 23 y.o. female.     The history is provided by the patient.  URI  Presenting symptoms: congestion, cough, rhinorrhea and sore throat   Presenting symptoms: no ear pain and no fever   Severity:  Moderate Onset quality:  Gradual Duration:  2 days Timing:  Intermittent Progression:  Worsening Chronicity:  New Relieved by:  Nothing Worsened by:  Nothing Associated symptoms: headaches   Associated symptoms: no arthralgias, no myalgias, no neck pain, no sneezing and no wheezing   Risk factors: sick contacts   Risk factors: no immunosuppression, no recent illness and no recent travel     Past Medical History:  Diagnosis Date  . Medical history non-contributory     Patient Active Problem List   Diagnosis Date Noted  . Normal labor 06/19/2017  . SVD (spontaneous vaginal delivery) 06/19/2017  . Marijuana use 12/07/2016  . Supervision of normal first pregnancy 11/22/2016    Past Surgical History:  Procedure Laterality Date  . NO PAST SURGERIES       OB History    Gravida  1   Para  1   Term  1   Preterm  0   AB  0   Living  1     SAB  0   TAB  0   Ectopic  0   Multiple  0   Live Births  1            Home Medications    Prior to Admission medications   Medication Sig Start Date End Date Taking? Authorizing Provider  DM-Phenylephrine-Acetaminophen (VICKS DAYQUIL COLD & FLU) 10-5-325 MG CAPS Take 2 capsules by mouth 2 (two) times daily.   Yes [provider]  ibuprofen (ADVIL,MOTRIN) 600 MG tablet Take 1 tablet (600 mg total) by mouth every 6 (six) hours. Patient not taking: Reported on 07/23/2017 06/21/17   Jacklyn Shell, CNM  JUNEL FE 1/20 1-20 MG-MCG tablet Take 1 tablet by mouth daily. 03/18/18   [provider]    Family History Family History  Problem Relation Age of Onset  . Diabetes Paternal Grandmother   . Diabetes Maternal Grandmother     Social History Social History   Tobacco Use  . Smoking status: Never Smoker  . Smokeless tobacco: Never Used  Substance Use Topics  . Alcohol use: Yes    Alcohol/week: 0.0 standard drinks    Comment: occasional  . Drug use: Not Currently    Types: Marijuana    Comment: last used June 2018     Allergies   Patient has no known allergies.   Review of Systems Review of Systems  Constitutional: Negative for activity change and fever.       All ROS Neg except as noted in HPI  HENT: Positive for congestion, rhinorrhea and sore throat. Negative for ear pain, nosebleeds and sneezing.   Eyes: Negative for photophobia and discharge.  Respiratory: Positive for cough. Negative for shortness of breath and wheezing.   Cardiovascular: Negative for chest pain and palpitations.  Gastrointestinal: Negative for abdominal pain and blood in stool.  Genitourinary: Negative for dysuria, frequency and hematuria.  Musculoskeletal: Negative for arthralgias, back pain, myalgias and neck pain.  Skin: Negative.   Neurological: Positive for headaches.  Negative for dizziness, seizures and speech difficulty.  Psychiatric/Behavioral: Negative for confusion and hallucinations.     Physical Exam Updated Vital Signs BP 116/65 (BP Location: Right Arm)   Pulse (!) 101   Temp 98.3 F (36.8 C) (Oral)   Resp 16   Ht 5\' 6"  (1.676 m)   Wt 78.5 kg   LMP 05/17/2018   SpO2 100%   BMI 27.92 kg/m   Physical Exam Vitals signs and nursing note reviewed.  Constitutional:      Appearance: She is well-developed. She is not toxic-appearing.  HENT:     Head: Normocephalic.     Right Ear: Tympanic membrane and external ear normal.     Left Ear: Tympanic membrane and external ear normal.     Nose: Congestion present.     Mouth/Throat:     Mouth: Mucous  membranes are moist.     Pharynx: No oropharyngeal exudate.  Eyes:     General: Lids are normal.     Pupils: Pupils are equal, round, and reactive to light.  Neck:     Musculoskeletal: Normal range of motion and neck supple.     Vascular: No carotid bruit.  Cardiovascular:     Rate and Rhythm: Regular rhythm. Tachycardia present.     Pulses: Normal pulses.     Heart sounds: Normal heart sounds.  Pulmonary:     Effort: No respiratory distress.     Breath sounds: Normal breath sounds.  Abdominal:     General: Bowel sounds are normal.     Palpations: Abdomen is soft.     Tenderness: There is no abdominal tenderness. There is no guarding.  Musculoskeletal: Normal range of motion.  Lymphadenopathy:     Head:     Right side of head: No submandibular adenopathy.     Left side of head: No submandibular adenopathy.     Cervical: No cervical adenopathy.  Skin:    General: Skin is warm and dry.  Neurological:     Mental Status: She is alert and oriented to person, place, and time.     Cranial Nerves: No cranial nerve deficit.     Sensory: No sensory deficit.  Psychiatric:        Speech: Speech normal.      ED Treatments / Results  Labs (all labs ordered are listed, but only abnormal results are displayed) Labs Reviewed - No data to display  EKG None  Radiology No results found.  Procedures Procedures (including critical care time)  Medications Ordered in ED Medications - No data to display   Initial Impression / Assessment and Plan / ED Course  I have reviewed the triage vital signs and the nursing notes.  Pertinent labs & imaging results that were available during my care of the patient were reviewed by me and considered in my medical decision making (see chart for details).          Final Clinical Impressions(s) / ED Diagnoses MDM  Vital signs reviewed.  Pulse oximetry is 100% on room air.  Within normal limits by my interpretation.  The patient presented  to the emergency department with a cough and congestion as well as sore throat and some pressure in her sinuses.  I have advised the patient to use medications for her symptoms including a decongestant, Tylenol, and/or ibuprofen for body aches, and fever.  I have provided a mask for the patient and I have advised her to wash hands frequently.  I advised her to  also wash off surfaces as needed.  I have asked her to self quarantine over the next 10 to 14 days.  The patient is given strict instructions to return immediately if any changes in her condition, problems, or concerns.     Final diagnoses:  Viral URI with cough    ED Discharge Orders    None       Ivery Quale, PA-C 07/13/18 2637    Pricilla Loveless, MD 07/13/18 [phone]

## 2018-09-11 ENCOUNTER — Encounter: Payer: Self-pay | Admitting: *Deleted

## 2018-09-12 ENCOUNTER — Encounter: Payer: Medicaid Other | Admitting: Adult Health

## 2020-12-28 ENCOUNTER — Other Ambulatory Visit: Payer: Self-pay

## 2020-12-28 ENCOUNTER — Encounter: Payer: Self-pay | Admitting: Emergency Medicine

## 2020-12-28 ENCOUNTER — Ambulatory Visit
Admission: EM | Admit: 2020-12-28 | Discharge: 2020-12-28 | Disposition: A | Discharge status: Home / Self Care | Payer: Medicaid Other | Attending: Family Medicine | Admitting: Family Medicine

## 2020-12-28 DIAGNOSIS — J069 Acute upper respiratory infection, unspecified: Secondary | ICD-10-CM

## 2020-12-28 NOTE — Discharge Instructions (Addendum)
You may use over the counter ibuprofen or acetaminophen as needed.  °For a sore throat, over the counter products such as [employer] Peroxyl Mouth Sore Rinse or Chloraseptic Sore Throat Spray may provide some temporary relief. ° ° ° ° °

## 2020-12-28 NOTE — ED Triage Notes (Signed)
Productive cough x 1 week with green sputum, sore throat since Friday.

## 2020-12-28 NOTE — ED Provider Notes (Signed)
  [facility] CARE CENTER   [id number] 12/28/20 Arrival Time: 4098  ASSESSMENT & PLAN:  1. Viral URI with cough    Discussed typical duration of viral illnesses. COVID-19 testing sent. OTC symptom care as needed.  Labs Reviewed  NOVEL CORONAVIRUS, NAA      Follow-up Information     [facility]   Specialty: Urgent Care Why: As needed. Contact information: [address] [place] [address] [phone]                Reviewed expectations re: course of current medical issues. Questions answered. Outlined signs and symptoms indicating need for more acute intervention. Understanding verbalized. After Visit Summary given.   SUBJECTIVE: History from: patient. Angelica Ramirez is a 25 y.o. female who presents with worries regarding COVID-19. Known COVID-19 contact: none. Recent travel: none. Reports: cough over past week; mild ST. Denies: fever and difficulty breathing. Normal PO intake without n/v/d.   OBJECTIVE:  Vitals:   12/28/20 0940  BP: 110/69  Pulse: 81  Resp: 18  Temp: 97.8 F (36.6 C)  SpO2: 97%    General appearance: alert; no distress Eyes: PERRLA; EOMI; conjunctiva normal HENT: [place]; AT; with nasal congestion; throat irritation Neck: supple  Lungs: speaks full sentences without difficulty; unlabored Extremities: no edema Skin: warm and dry Neurologic: normal gait Psychological: alert and cooperative; normal mood and affect  Labs:  Labs Reviewed  NOVEL CORONAVIRUS, NAA     No Known Allergies  Past Medical History:  Diagnosis Date   Medical history non-contributory    Social History   Socioeconomic History   Marital status: Single    Spouse name: Not on file   Number of children: Not on file   Years of education: Not on file   Highest education level: Not on file  Occupational History   Not on file  Tobacco Use   Smoking status: Never   Smokeless tobacco: Never   Vaping Use   Vaping Use: Never used  Substance and Sexual Activity   Alcohol use: Yes    Alcohol/week: 0.0 standard drinks    Comment: occasional   Drug use: Not Currently    Types: Marijuana    Comment: last used June 2018   Sexual activity: Yes    Birth control/protection: None  Other Topics Concern   Not on file  Social History Narrative   Not on file   Social Determinants of Health   Financial Resource Strain: Not on file  Food Insecurity: Not on file  Transportation Needs: Not on file  Physical Activity: Not on file  Stress: Not on file  Social Connections: Not on file  Intimate Partner Violence: Not on file   Family History  Problem Relation Age of Onset   Diabetes Paternal Grandmother    Diabetes Maternal Grandmother    Past Surgical History:  Procedure Laterality Date   NO PAST SURGERIES       Mardella Layman, MD 12/28/20 1332

## 2020-12-29 LAB — NOVEL CORONAVIRUS, NAA: SARS-CoV-2, NAA: NOT DETECTED

## 2020-12-29 LAB — SARS-COV-2, NAA 2 DAY TAT

## 2021-02-02 ENCOUNTER — Telehealth: Payer: Medicaid Other | Admitting: Physician Assistant

## 2021-02-02 DIAGNOSIS — N390 Urinary tract infection, site not specified: Secondary | ICD-10-CM

## 2021-02-02 MED ORDER — CEPHALEXIN 500 MG PO CAPS: 500.0000 mg | ORAL_CAPSULE | Freq: Two times a day (BID) | ORAL | 0 refills | Status: AC

## 2021-02-02 NOTE — Patient Instructions (Addendum)
Angelica Ramirez, thank you for joining [name], PA-C for today's virtual visit.  While this provider is not your primary care provider (PCP), if your PCP is located in our provider database this encounter information will be shared with them immediately following your visit.  Consent: (Patient) Angelica Ramirez provided verbal consent for this virtual visit at the beginning of the encounter.  Current Medications:  Current Outpatient Medications:    ibuprofen (ADVIL,MOTRIN) 600 MG tablet, Take 1 tablet (600 mg total) by mouth every 6 (six) hours. (Patient not taking: Reported on 07/23/2017), Disp: 30 tablet, Rfl: 0   JUNEL FE 1/20 1-20 MG-MCG tablet, Take 1 tablet by mouth daily., Disp: , Rfl:    Medications ordered in this encounter:  No orders of the defined types were placed in this encounter.    *If you need refills on other medications prior to your next appointment, please contact your pharmacy*  Follow-Up: Call back or seek an in-person evaluation if the symptoms worsen or if the condition fails to improve as anticipated.  Other Instructions Your symptoms are consistent with a bladder infection, also called acute cystitis. Please take your antibiotic (Keflex) as directed until all pills are gone.  Stay very well hydrated.  Consider a daily probiotic (Align, Culturelle, or Activia) to help prevent stomach upset caused by the antibiotic.  Taking a probiotic daily may also help prevent recurrent UTIs.  Also consider taking AZO (Phenazopyridine) tablets to help decrease pain with urination.    Urinary Tract Infection A urinary tract infection (UTI) can occur any place along the urinary tract. The tract includes the kidneys, ureters, bladder, and urethra. A type of germ called bacteria often causes a UTI. UTIs are often helped with antibiotic medicine.  HOME CARE  If given, take antibiotics as told by your doctor. Finish them even if you start to feel better. Drink enough  fluids to keep your pee (urine) clear or pale yellow. Avoid tea, drinks with caffeine, and bubbly (carbonated) drinks. Pee often. Avoid holding your pee in for a long time. Pee before and after having sex (intercourse). Wipe from front to back after you poop (bowel movement) if you are a woman. Use each tissue only once. GET HELP RIGHT AWAY IF:  You have back pain. You have lower belly (abdominal) pain. You have chills. You feel sick to your stomach (nauseous). You throw up (vomit). Your burning or discomfort with peeing does not go away. You have a fever. Your symptoms are not better in 3 days. MAKE SURE YOU:  Understand these instructions. Will watch your condition. Will get help right away if you are not doing well or get worse. Document Released: 09/19/2007 Document Revised: 12/26/2011 Document Reviewed: 11/01/2011 [facility] Patient Information 2015 [place], [place]. This information is not intended to replace advice given to you by your health care provider. Make sure you discuss any questions you have with your health care provider.   If you have been instructed to have an in-person evaluation today at a local Urgent Care facility, please use the link below. It will take you to a list of all of our available [facility], including address, phone number and hours of operation. Please do not delay care.  [facility]  If you or a family member do not have a primary care provider, use the link below to schedule a visit and establish care. When you choose a [facility] primary care physician or advanced practice provider,  you gain a long-term partner in health. Find a Primary Care Provider  Learn more about [facility]'s in-office and virtual care options: [facility]

## 2021-02-02 NOTE — Progress Notes (Signed)
Virtual Visit Consent   Angelica Ramirez, you are scheduled for a virtual visit with a Angelica Ramirez provider today.     Just as with appointments in the office, your consent must be obtained to participate.  Your consent will be active for this visit and any virtual visit you may have with one of our providers in the next 365 days.     If you have a MyChart account, a copy of this consent can be sent to you electronically.  All virtual visits are billed to your insurance company just like a traditional visit in the office.    As this is a virtual visit, video technology does not allow for your provider to perform a traditional examination.  This may limit your provider's ability to fully assess your condition.  If your provider identifies any concerns that need to be evaluated in person or the need to arrange testing (such as labs, EKG, etc.), we will make arrangements to do so.     Although advances in technology are sophisticated, we cannot ensure that it will always work on either your end or our end.  If the connection with a video visit is poor, the visit may have to be switched to a telephone visit.  With either a video or telephone visit, we are not always able to ensure that we have a secure connection.     I need to obtain your verbal consent now.   Are you willing to proceed with your visit today?    [name] has provided verbal consent on 02/02/2021 for a virtual visit (video or telephone).   Angelica Ramirez, [place]   Date: 02/02/2021 8:09 AM   Virtual Visit via Video Note   I, Angelica Ramirez, connected with  Angelica Ramirez  ([id number], 23-Jul-1995) on 02/02/21 at  8:15 AM EDT by a video-enabled telemedicine application and verified that I am speaking with the correct person using two identifiers.  Location: Patient: Virtual Visit Location Patient: Mobile - parked car Provider: Virtual Visit Location Provider: Home Office   I discussed the limitations of  evaluation and management by telemedicine and the availability of in person appointments. The patient expressed understanding and agreed to proceed.    History of Present Illness: Angelica Ramirez is a 25 y.o. who identifies as a female who was assigned female at birth, and is being seen today for possible UTI. Patient endorses symptoms starting around 2 days ago with urinary urgency, frequency, suprapubic pressure and dysuria. Denies dysuria, fevers, chills, back pain or belly pain. LMP ended last Friday.  Denies concerns of being pregnant. Denies concerns of STI. Denies vaginal symptoms. Has remote history of UTI but no issue in some time. Has been trying to keep well-hydrated.   HPI: HPI  Problems:  Patient Active Problem List   Diagnosis Date Noted   Normal labor 06/19/2017   SVD (spontaneous vaginal delivery) 06/19/2017   Marijuana use 12/07/2016   Supervision of normal first pregnancy 11/22/2016    Allergies: No Known Allergies Medications:  Current Outpatient Medications:    cephALEXin (KEFLEX) 500 MG capsule, Take 1 capsule (500 mg total) by mouth 2 (two) times daily for 7 days., Disp: 14 capsule, Rfl: 0  Observations/Objective: Patient is well-developed, well-nourished in no acute distress.  Resting comfortably in parked car. Head is normocephalic, atraumatic.  No labored breathing. Speech is clear and coherent with logical content.  Patient is alert and oriented at baseline.   Assessment  and Plan: 1. UTI (urinary tract infection), uncomplicated - cephALEXin (KEFLEX) 500 MG capsule; Take 1 capsule (500 mg total) by mouth 2 (two) times daily for 7 days.  Dispense: 14 capsule; Refill: 0 No alarm signs/symptoms. Classic UTI symptoms. Start Keflex empirically. Supportive measures and OTC medications reviewed. Strict in person evaluation precautions reviewed.   Follow Up Instructions: I discussed the assessment and treatment plan with the patient. The patient was provided an  opportunity to ask questions and all were answered. The patient agreed with the plan and demonstrated an understanding of the instructions.  A copy of instructions were sent to the patient via MyChart unless otherwise noted below.   The patient was advised to call back or seek an in-person evaluation if the symptoms worsen or if the condition fails to improve as anticipated.  Time:  I spent 10 minutes with the patient via telehealth technology discussing the above problems/concerns.    Angelica Climes, PA-C

## 2021-06-02 ENCOUNTER — Telehealth: Payer: Medicaid Other | Admitting: Nurse Practitioner

## 2021-06-02 DIAGNOSIS — R399 Unspecified symptoms and signs involving the genitourinary system: Secondary | ICD-10-CM

## 2021-06-02 MED ORDER — SULFAMETHOXAZOLE-TRIMETHOPRIM 800-160 MG PO TABS: 1.0000 | ORAL_TABLET | Freq: Two times a day (BID) | ORAL | 0 refills | Status: DC

## 2021-06-02 NOTE — Patient Instructions (Signed)

## 2021-06-02 NOTE — Progress Notes (Signed)
Virtual Visit Consent   Angelica Ramirez, you are scheduled for a virtual visit with Angelica Ramirez, [place], a [facility] provider, today.     Just as with appointments in the office, your consent must be obtained to participate.  Your consent will be active for this visit and any virtual visit you may have with one of our providers in the next 365 days.     If you have a MyChart account, a copy of this consent can be sent to you electronically.  All virtual visits are billed to your insurance company just like a traditional visit in the office.    As this is a virtual visit, video technology does not allow for your provider to perform a traditional examination.  This may limit your provider's ability to fully assess your condition.  If your provider identifies any concerns that need to be evaluated in person or the need to arrange testing (such as labs, EKG, etc.), we will make arrangements to do so.     Although advances in technology are sophisticated, we cannot ensure that it will always work on either your end or our end.  If the connection with a video visit is poor, the visit may have to be switched to a telephone visit.  With either a video or telephone visit, we are not always able to ensure that we have a secure connection.     I need to obtain your verbal consent now.   Are you willing to proceed with your visit today? YES   Angelica Ramirez has provided verbal consent on 06/02/2021 for a virtual visit (video or telephone).   Angelica Hassell Done, FNP   Date: 06/02/2021 5:59 PM   Virtual Visit via Video Note   I, Angelica Ramirez, connected with Angelica Ramirez ([id number], 13-May-1995) on 06/02/21 at  6:00 PM EST by a video-enabled telemedicine application and verified that I am speaking with the correct person using two identifiers.  Location: Patient: Virtual Visit Location Patient: Home Provider: Virtual Visit Location Provider: Mobile   I discussed the limitations  of evaluation and management by telemedicine and the availability of in person appointments. The patient expressed understanding and agreed to proceed.    History of Present Illness: Angelica Ramirez is a 26 y.o. who identifies as a female who was assigned female at birth, and is being seen today for uti .  HPI: Urinary Tract Infection  This is a new problem. The current episode started yesterday. The problem occurs every urination. The problem has been waxing and waning. The quality of the pain is described as burning. The pain is at a severity of 8/10. The pain is severe. There has been no fever. She is Sexually active. There is No history of pyelonephritis. Associated symptoms include frequency, hesitancy and urgency. Pertinent negatives include no chills or hematuria. She has tried nothing for the symptoms.   Review of Systems  Constitutional:  Negative for chills.  Genitourinary:  Positive for frequency, hesitancy and urgency. Negative for hematuria.   Problems:  Patient Active Problem List   Diagnosis Date Noted   Normal labor 06/19/2017   SVD (spontaneous vaginal delivery) 06/19/2017   Marijuana use 12/07/2016   Supervision of normal first pregnancy 11/22/2016    Allergies: No Known Allergies Medications: No current outpatient medications on file.  Observations/Objective: Patient is well-developed, well-nourished in no acute distress.  Resting comfortably  at home.  Head is normocephalic, atraumatic.  No labored breathing.  Speech  is clear and coherent with logical content.  Patient is alert and oriented at baseline.  Denies suprapubic pain  Assessment and Plan:  Angelica Ramirez in today with chief complaint of Urinary Tract Infection   1. UTI symptoms Take medication as prescribe Cotton underwear Take shower not bath Cranberry juice, yogurt Force fluids AZO over the counter X2 days RTO prn  Meds ordered this encounter  Medications   sulfamethoxazole-trimethoprim  (BACTRIM DS) 800-160 MG tablet    Sig: Take 1 tablet by mouth 2 (two) times daily.    Dispense:  14 tablet    Refill:  0    Order Specific Question:   Supervising Provider    Answer:   Noemi Chapel [[id number]]       Follow Up Instructions: I discussed the assessment and treatment plan with the patient. The patient was provided an opportunity to ask questions and all were answered. The patient agreed with the plan and demonstrated an understanding of the instructions.  A copy of instructions were sent to the patient via MyChart.  The patient was advised to call back or seek an in-person evaluation if the symptoms worsen or if the condition fails to improve as anticipated.  Time:  I spent 8 minutes with the patient via telehealth technology discussing the above problems/concerns.    Angelica Hassell Done, FNP

## 2021-11-03 DIAGNOSIS — Z23 Encounter for immunization: Secondary | ICD-10-CM | POA: Diagnosis not present

## 2021-12-20 ENCOUNTER — Encounter: Payer: Self-pay | Admitting: Emergency Medicine

## 2021-12-20 ENCOUNTER — Ambulatory Visit
Admission: EM | Admit: 2021-12-20 | Discharge: 2021-12-20 | Disposition: A | Discharge status: Home / Self Care | Payer: 59 | Attending: Family Medicine | Admitting: Family Medicine

## 2021-12-20 ENCOUNTER — Other Ambulatory Visit: Payer: Self-pay

## 2021-12-20 DIAGNOSIS — R0981 Nasal congestion: Secondary | ICD-10-CM | POA: Diagnosis not present

## 2021-12-20 DIAGNOSIS — Z20822 Contact with and (suspected) exposure to covid-19: Secondary | ICD-10-CM | POA: Diagnosis not present

## 2021-12-20 DIAGNOSIS — R110 Nausea: Secondary | ICD-10-CM | POA: Diagnosis not present

## 2021-12-20 DIAGNOSIS — Z3201 Encounter for pregnancy test, result positive: Secondary | ICD-10-CM

## 2021-12-20 LAB — POCT URINE PREGNANCY: Preg Test, Ur: POSITIVE — AB

## 2021-12-20 LAB — RESP PANEL BY RT-PCR (FLU A&B, COVID) ARPGX2
Influenza A by PCR: NEGATIVE
Influenza B by PCR: NEGATIVE
SARS Coronavirus 2 by RT PCR: NEGATIVE

## 2021-12-20 MED ORDER — DOXYLAMINE-PYRIDOXINE 10-10 MG PO TBEC: 2.0000 | DELAYED_RELEASE_TABLET | Freq: Every evening | ORAL | 0 refills | Status: DC

## 2021-12-20 NOTE — ED Provider Notes (Signed)
[facility]    CSN: [id number] Arrival date & time: 12/20/21  1535      History   Chief Complaint Chief Complaint  Patient presents with   Possible Pregnancy    HPI Angelica Ramirez is a 26 y.o. female.   Patient presenting today with 1 day history of nausea, nasal congestion.  She is also had some mild cramping in her lower abdomen here and there.  She states that she took a home pregnancy test and saw 2 lines, wanting to confirm pregnancy today as well as be tested for COVID.  She states her last period was 11/29/2021 but it was a much lighter bleed than she usually would experience.  She is not currently on any contraceptives.  Denies any vaginal bleeding, discharge, rashes lesions, abdominal pain.  Not trying anything over-the-counter for symptoms.    Past Medical History:  Diagnosis Date   Medical history non-contributory     Patient Active Problem List   Diagnosis Date Noted   Normal labor 06/19/2017   SVD (spontaneous vaginal delivery) 06/19/2017   Marijuana use 12/07/2016   Supervision of normal first pregnancy 11/22/2016    Past Surgical History:  Procedure Laterality Date   NO PAST SURGERIES      OB History     Gravida  1   Para  1   Term  1   Preterm  0   AB  0   Living  1      SAB  0   IAB  0   Ectopic  0   Multiple  0   Live Births  1            Home Medications    Prior to Admission medications   Medication Sig Start Date End Date Taking? Authorizing Provider  Doxylamine-Pyridoxine 10-10 MG TBEC Take 2 tablets by mouth every evening. 12/20/21  Yes Particia Nearing, PA-C  sulfamethoxazole-trimethoprim (BACTRIM DS) 800-160 MG tablet Take 1 tablet by mouth 2 (two) times daily. 06/02/21   Bennie Pierini, FNP    Family History Family History  Problem Relation Age of Onset   Diabetes Paternal Grandmother    Diabetes Maternal Grandmother     Social History Social History   Tobacco Use   Smoking  status: Never   Smokeless tobacco: Never  Vaping Use   Vaping Use: Never used  Substance Use Topics   Alcohol use: Not Currently    Comment: occasional   Drug use: Not Currently    Types: Marijuana    Comment: last used June 2018     Allergies   Patient has no known allergies.   Review of Systems Review of Systems Per HPI  Physical Exam Triage Vital Signs ED Triage Vitals  Enc Vitals Group     BP 12/20/21 1724 138/84     Pulse Rate 12/20/21 1724 91     Resp 12/20/21 1724 20     Temp 12/20/21 1724 98 F (36.7 C)     Temp Source 12/20/21 1724 Oral     SpO2 12/20/21 1724 99 %     Weight --      Height --      Head Circumference --      Peak Flow --      Pain Score 12/20/21 1725 2     Pain Loc --      Pain Edu? --      Excl. in GC? --    No data  found.  Updated Vital Signs BP 138/84 (BP Location: Right Arm)   Pulse 91   Temp 98 F (36.7 C) (Oral)   Resp 20   LMP 11/29/2021 (Approximate)   SpO2 99%   Visual Acuity Right Eye Distance:   Left Eye Distance:   Bilateral Distance:    Right Eye Near:   Left Eye Near:    Bilateral Near:     Physical Exam Vitals and nursing note reviewed.  Constitutional:      Appearance: Normal appearance. She is not ill-appearing.  HENT:     Head: Atraumatic.     Nose: Rhinorrhea present.     Mouth/Throat:     Mouth: Mucous membranes are moist.  Eyes:     Extraocular Movements: Extraocular movements intact.     Conjunctiva/sclera: Conjunctivae normal.  Cardiovascular:     Rate and Rhythm: Normal rate and regular rhythm.     Heart sounds: Normal heart sounds.  Pulmonary:     Effort: Pulmonary effort is normal.     Breath sounds: Normal breath sounds.  Abdominal:     General: Bowel sounds are normal. There is no distension.     Palpations: Abdomen is soft.     Tenderness: There is no abdominal tenderness. There is no guarding.  Musculoskeletal:        General: Normal range of motion.     Cervical back: Normal  range of motion and neck supple.  Skin:    General: Skin is warm and dry.  Neurological:     Mental Status: She is alert and oriented to person, place, and time.  Psychiatric:        Mood and Affect: Mood normal.        Thought Content: Thought content normal.        Judgment: Judgment normal.      UC Treatments / Results  Labs (all labs ordered are listed, but only abnormal results are displayed) Labs Reviewed  POCT URINE PREGNANCY - Abnormal; Notable for the following components:      Result Value   Preg Test, Ur Positive (*)    All other components within normal limits  RESP PANEL BY RT-PCR (FLU A&B, COVID) [id number]    EKG   Radiology No results found.  Procedures Procedures (including critical care time)  Medications Ordered in UC Medications - No data to display  Initial Impression / Assessment and Plan / UC Course  I have reviewed the triage vital signs and the nursing notes.  Pertinent labs & imaging results that were available during my care of the patient were reviewed by me and considered in my medical decision making (see chart for details).     Vital signs and exam reassuring today and patient appears in no acute distress.  Respiratory panel pending, pregnancy test positive.  Reviewed these results with patient and discussed prenatal vitamins, OB/GYN follow-up and healthy lifestyle changes for pregnancy.  Unclear if her current symptoms are related to a new viral infection or early pregnancy, will prescribe likely just in case nausea continues and discussed supportive over-the-counter pregnancy safe medications for the nasal congestion.  She has an OB/GYN and plans to call for a follow-up.  Return for any worsening symptoms.  Work note given.  Final Clinical Impressions(s) / UC Diagnoses   Final diagnoses:  Nasal congestion  Nausea without vomiting  Positive pregnancy test   Discharge Instructions   None    ED Prescriptions     Medication Sig  Dispense Auth. Provider   Doxylamine-Pyridoxine 10-10 MG TBEC Take 2 tablets by mouth every evening. 60 tablet Particia Nearing, [place]      PDMP not reviewed this encounter.   Particia Nearing, [place] 12/20/21 1856

## 2021-12-20 NOTE — ED Triage Notes (Signed)
Pt reports nasal congestion for last several days and reports family wanted pt tested for covid. Pt also reports took home pregnancy test and reports "saw two lines" and would like to confirm pregnancy. Last period 8/16 but reports "it was a light one".

## 2021-12-22 ENCOUNTER — Other Ambulatory Visit: Payer: 59

## 2021-12-26 ENCOUNTER — Other Ambulatory Visit: Payer: 59

## 2021-12-26 DIAGNOSIS — Z3201 Encounter for pregnancy test, result positive: Secondary | ICD-10-CM | POA: Diagnosis not present

## 2021-12-27 LAB — BETA HCG QUANT (REF LAB): hCG Quant: 6725 m[IU]/mL

## 2022-01-04 ENCOUNTER — Telehealth: Payer: 59 | Admitting: Family Medicine

## 2022-01-04 ENCOUNTER — Telehealth: Payer: Self-pay | Admitting: Advanced Practice Midwife

## 2022-01-04 DIAGNOSIS — O219 Vomiting of pregnancy, unspecified: Secondary | ICD-10-CM | POA: Diagnosis not present

## 2022-01-04 MED ORDER — ONDANSETRON HCL 4 MG PO TABS: 4.0000 mg | ORAL_TABLET | Freq: Three times a day (TID) | ORAL | 0 refills | Status: DC | PRN

## 2022-01-04 NOTE — Telephone Encounter (Signed)
Patient has been sick in bed for a week due to nausea/vomiting. She did a telehealth call and they prescribed zofran. She was told to call to make sure she could take that. Please advise.

## 2022-01-04 NOTE — Progress Notes (Signed)
Virtual Visit Consent   [name], you are scheduled for a virtual visit with a [facility] provider today. Just as with appointments in the office, your consent must be obtained to participate. Your consent will be active for this visit and any virtual visit you may have with one of our providers in the next 365 days. If you have a MyChart account, a copy of this consent can be sent to you electronically.  As this is a virtual visit, video technology does not allow for your provider to perform a traditional examination. This may limit your provider's ability to fully assess your condition. If your provider identifies any concerns that need to be evaluated in person or the need to arrange testing (such as labs, EKG, etc.), we will make arrangements to do so. Although advances in technology are sophisticated, we cannot ensure that it will always work on either your end or our end. If the connection with a video visit is poor, the visit may have to be switched to a telephone visit. With either a video or telephone visit, we are not always able to ensure that we have a secure connection.  By engaging in this virtual visit, you consent to the provision of healthcare and authorize for your insurance to be billed (if applicable) for the services provided during this visit. Depending on your insurance coverage, you may receive a charge related to this service.  I need to obtain your verbal consent now. Are you willing to proceed with your visit today? Angelica Ramirez has provided verbal consent on 01/04/2022 for a virtual visit (video or telephone). Freddy Finner, NP  Date: 01/04/2022 2:52 PM  Virtual Visit via Video Note   I, Freddy Finner, connected with  Angelica Ramirez  ([id number], May 24, 1995) on 01/04/22 at  3:00 PM EDT by a video-enabled telemedicine application and verified that I am speaking with the correct person using two identifiers.  Location: Patient: Virtual Visit Location Patient:  Home Provider: Virtual Visit Location Provider: Home Office   I discussed the limitations of evaluation and management by telemedicine and the availability of in person appointments. The patient expressed understanding and agreed to proceed.    History of Present Illness: Angelica Ramirez is a 26 y.o. who identifies as a female who was assigned female at birth, and is being seen today for n/v in pregnancy at 6-7 weeks. OB office notified but no appts available.  Second pregnancy- reports this happened in first trimester of first pregnancy and she used zofran as needed then. Denies bleeding, or other complications or concerns    Problems:  Patient Active Problem List   Diagnosis Date Noted   Normal labor 06/19/2017   SVD (spontaneous vaginal delivery) 06/19/2017   Marijuana use 12/07/2016   Supervision of normal first pregnancy 11/22/2016    Allergies: No Known Allergies Medications:  Current Outpatient Medications:    Doxylamine-Pyridoxine 10-10 MG TBEC, Take 2 tablets by mouth every evening., Disp: 60 tablet, Rfl: 0   sulfamethoxazole-trimethoprim (BACTRIM DS) 800-160 MG tablet, Take 1 tablet by mouth 2 (two) times daily., Disp: 14 tablet, Rfl: 0  Observations/Objective: Patient is well-developed, well-nourished in no acute distress.  Resting comfortably  at home.  Head is normocephalic, atraumatic.  No labored breathing. Speech is clear and coherent with logical content.  Patient is alert and oriented at baseline.   Assessment and Plan: 1. Nausea and vomiting in pregnancy prior to [redacted] weeks gestation - ondansetron (ZOFRAN) 4  MG tablet; Take 1 tablet (4 mg total) by mouth every 8 (eight) hours as needed for nausea or vomiting.  Dispense: 10 tablet; Refill: 0   -rest  -hydrate -call ob    Reviewed side effects, risks and benefits of medication.    Patient acknowledged agreement and understanding of the plan.   Past Medical, Surgical, Social History, Allergies, and  Medications have been Reviewed.   Follow Up Instructions: I discussed the assessment and treatment plan with the patient. The patient was provided an opportunity to ask questions and all were answered. The patient agreed with the plan and demonstrated an understanding of the instructions.  A copy of instructions were sent to the patient via MyChart unless otherwise noted below.     The patient was advised to call back or seek an in-person evaluation if the symptoms worsen or if the condition fails to improve as anticipated.  Time:  I spent 10 minutes with the patient via telehealth technology discussing the above problems/concerns.    Perlie Mayo, NP

## 2022-01-17 ENCOUNTER — Other Ambulatory Visit: Payer: Self-pay | Admitting: Obstetrics & Gynecology

## 2022-01-17 DIAGNOSIS — O3680X0 Pregnancy with inconclusive fetal viability, not applicable or unspecified: Secondary | ICD-10-CM

## 2022-01-18 ENCOUNTER — Ambulatory Visit (INDEPENDENT_AMBULATORY_CARE_PROVIDER_SITE_OTHER): Payer: 59

## 2022-01-18 ENCOUNTER — Other Ambulatory Visit: Payer: 59

## 2022-01-18 DIAGNOSIS — O3680X0 Pregnancy with inconclusive fetal viability, not applicable or unspecified: Secondary | ICD-10-CM | POA: Diagnosis not present

## 2022-01-18 NOTE — Progress Notes (Signed)
[place] 8+4 wks,single IUP with yolk sac,CRL 19.7 mm,FHR 173 bpm,normal ovaries

## 2022-02-14 ENCOUNTER — Other Ambulatory Visit: Payer: Self-pay | Admitting: Obstetrics & Gynecology

## 2022-02-14 ENCOUNTER — Encounter: Payer: Self-pay | Admitting: Advanced Practice Midwife

## 2022-02-14 DIAGNOSIS — Z3490 Encounter for supervision of normal pregnancy, unspecified, unspecified trimester: Secondary | ICD-10-CM | POA: Insufficient documentation

## 2022-02-14 DIAGNOSIS — Z3682 Encounter for antenatal screening for nuchal translucency: Secondary | ICD-10-CM

## 2022-02-15 ENCOUNTER — Ambulatory Visit (INDEPENDENT_AMBULATORY_CARE_PROVIDER_SITE_OTHER): Payer: 59

## 2022-02-15 ENCOUNTER — Ambulatory Visit: Payer: 59 | Admitting: *Deleted

## 2022-02-15 ENCOUNTER — Encounter: Payer: Self-pay | Admitting: Advanced Practice Midwife

## 2022-02-15 ENCOUNTER — Ambulatory Visit (INDEPENDENT_AMBULATORY_CARE_PROVIDER_SITE_OTHER): Payer: 59 | Admitting: Advanced Practice Midwife

## 2022-02-15 ENCOUNTER — Other Ambulatory Visit (HOSPITAL_COMMUNITY)
Admission: RE | Admit: 2022-02-15 | Discharge: 2022-02-15 | Disposition: A | Discharge status: Home / Self Care | Payer: 59 | Source: Ambulatory Visit | Attending: Advanced Practice Midwife | Admitting: Advanced Practice Midwife

## 2022-02-15 VITALS — BP 129/74 | HR 102 | Wt 196.0 lb

## 2022-02-15 DIAGNOSIS — Z3480 Encounter for supervision of other normal pregnancy, unspecified trimester: Secondary | ICD-10-CM | POA: Diagnosis not present

## 2022-02-15 DIAGNOSIS — Z113 Encounter for screening for infections with a predominantly sexual mode of transmission: Secondary | ICD-10-CM

## 2022-02-15 DIAGNOSIS — Z3A12 12 weeks gestation of pregnancy: Secondary | ICD-10-CM

## 2022-02-15 DIAGNOSIS — Z124 Encounter for screening for malignant neoplasm of cervix: Secondary | ICD-10-CM | POA: Insufficient documentation

## 2022-02-15 DIAGNOSIS — Z3682 Encounter for antenatal screening for nuchal translucency: Secondary | ICD-10-CM

## 2022-02-15 DIAGNOSIS — Z6830 Body mass index (BMI) 30.0-30.9, adult: Secondary | ICD-10-CM

## 2022-02-15 DIAGNOSIS — Z1332 Encounter for screening for maternal depression: Secondary | ICD-10-CM

## 2022-02-15 LAB — POCT URINALYSIS DIPSTICK OB
Blood, UA: NEGATIVE
Ketones, UA: NEGATIVE
Leukocytes, UA: NEGATIVE
Nitrite, UA: NEGATIVE

## 2022-02-15 NOTE — Patient Instructions (Addendum)
Angelica Ramirez, I greatly value your feedback.  If you receive a survey following your visit with [place] today, we appreciate you taking the time to fill it out.  Thanks, Cathie Beams, DNP, CNM  [facility] HAS MOVED!!! It is now [facility] ([address] [place], [place] [address]) Entrance located off of [address] Free 24/7 valet parking   Nausea & Vomiting Have saltine crackers or pretzels by your bed and eat a few bites before you raise your head out of bed in the morning Eat small frequent meals throughout the day instead of large meals Drink plenty of fluids throughout the day to stay hydrated, just don't drink a lot of fluids with your meals.  This can make your stomach fill up faster making you feel sick Do not brush your teeth right after you eat Products with real ginger are good for nausea, like ginger ale and ginger hard candy Make sure it says made with real ginger! Sucking on sour candy like lemon heads is also good for nausea If your prenatal vitamins make you nauseated, take them at night so you will sleep through the nausea Sea Bands If you feel like you need medicine for the nausea & vomiting please let us know If you are unable to keep any fluids or food down please let us know   Constipation Drink plenty of fluid, preferably water, throughout the day Eat foods high in fiber such as fruits, vegetables, and grains Exercise, such as walking, is a good way to keep your bowels regular Drink warm fluids, especially warm prune juice, or decaf coffee Eat a 1/2 cup of real oatmeal (not instant), 1/2 cup applesauce, and 1/2-1 cup warm prune juice every day If needed, you may take Colace (docusate sodium) stool softener once or twice a day to help keep the stool soft.  If you still are having problems with constipation, you may take Miralax once daily as needed to help keep your bowels regular.   Home Blood Pressure Monitoring for  Patients   Your provider has recommended that you check your blood pressure (BP) at least once a week at home. If you do not have a blood pressure cuff at home, one will be provided for you. Contact your provider if you have not received your monitor within 1 week.   Helpful Tips for Accurate Home Blood Pressure Checks  Don't smoke, exercise, or drink caffeine 30 minutes before checking your BP Use the restroom before checking your BP (a full bladder can raise your pressure) Relax in a comfortable upright chair Feet on the ground Left arm resting comfortably on a flat surface at the level of your heart Legs uncrossed Back supported Sit quietly and don't talk Place the cuff on your bare arm Adjust snuggly, so that only two fingertips can fit between your skin and the top of the cuff Check 2 readings separated by at least one minute Keep a log of your BP readings For a visual, please reference this diagram: http://ccnc.care/bpdiagram  Provider Name: [facility]     Phone: [phone]  Zone 1: ALL CLEAR  Continue to monitor your symptoms:  BP reading is less than 140 (top number) or less than 90 (bottom number)  No right upper stomach pain No headaches or seeing spots No feeling nauseated or throwing up No swelling in face and hands  Zone 2: CAUTION Call your doctor's office for any of the following:  BP reading is greater than 140 (top number) or greater than 90 (bottom number)  Stomach pain under your ribs in the middle or right side Headaches or seeing spots Feeling nauseated or throwing up Swelling in face and hands  Zone 3: EMERGENCY  Seek immediate medical care if you have any of the following:  BP reading is greater than160 (top number) or greater than 110 (bottom number) Severe headaches not improving with Tylenol Serious difficulty catching your breath Any worsening symptoms from Zone 2    First Trimester of Pregnancy The first trimester of pregnancy is from  week 1 until the end of week 12 (months 1 through 3). A week after a sperm fertilizes an egg, the egg will implant on the wall of the uterus. This embryo will begin to develop into a baby. Genes from you and your partner are forming the baby. The female genes determine whether the baby is a boy or a girl. At 6-8 weeks, the eyes and face are formed, and the heartbeat can be seen on ultrasound. At the end of 12 weeks, all the baby's organs are formed.  Now that you are pregnant, you will want to do everything you can to have a healthy baby. Two of the most important things are to get good prenatal care and to follow your health care provider's instructions. Prenatal care is all the medical care you receive before the baby's birth. This care will help prevent, find, and treat any problems during the pregnancy and childbirth. BODY CHANGES Your body goes through many changes during pregnancy. The changes vary from woman to woman.  You may gain or lose a couple of pounds at first. You may feel sick to your stomach (nauseous) and throw up (vomit). If the vomiting is uncontrollable, call your health care provider. You may tire easily. You may develop headaches that can be relieved by medicines approved by your health care provider. You may urinate more often. Painful urination may mean you have a bladder infection. You may develop heartburn as a result of your pregnancy. You may develop constipation because certain hormones are causing the muscles that push waste through your intestines to slow down. You may develop hemorrhoids or swollen, bulging veins (varicose veins). Your breasts may begin to grow larger and become tender. Your nipples may stick out more, and the tissue that surrounds them (areola) may become darker. Your gums may bleed and may be sensitive to brushing and flossing. Dark spots or blotches (chloasma, mask of pregnancy) may develop on your face. This will likely fade after the baby is  born. Your menstrual periods will stop. You may have a loss of appetite. You may develop cravings for certain kinds of food. You may have changes in your emotions from day to day, such as being excited to be pregnant or being concerned that something may go wrong with the pregnancy and baby. You may have more vivid and strange dreams. You may have changes in your hair. These can include thickening of your hair, rapid growth, and changes in texture. Some women also have hair loss during or after pregnancy, or hair that feels dry or thin. Your hair will most likely return to normal after your baby is born. WHAT TO EXPECT AT YOUR PRENATAL VISITS During a routine prenatal visit: You will be weighed to make sure you and the baby are growing normally. Your blood pressure will be taken. Your abdomen will be measured to track your baby's growth. The fetal  heartbeat will be listened to starting around week 10 or 12 of your pregnancy. Test results from any previous visits will be discussed. Your health care provider may ask you: How you are feeling. If you are feeling the baby move. If you have had any abnormal symptoms, such as leaking fluid, bleeding, severe headaches, or abdominal cramping. If you have any questions. Other tests that may be performed during your first trimester include: Blood tests to find your blood type and to check for the presence of any previous infections. They will also be used to check for low iron levels (anemia) and Rh antibodies. Later in the pregnancy, blood tests for diabetes will be done along with other tests if problems develop. Urine tests to check for infections, diabetes, or protein in the urine. An ultrasound to confirm the proper growth and development of the baby. An amniocentesis to check for possible genetic problems. Fetal screens for spina bifida and Down syndrome. You may need other tests to make sure you and the baby are doing well. HOME CARE  INSTRUCTIONS  Medicines Follow your health care provider's instructions regarding medicine use. Specific medicines may be either safe or unsafe to take during pregnancy. Take your prenatal vitamins as directed. If you develop constipation, try taking a stool softener if your health care provider approves. Diet Eat regular, well-balanced meals. Choose a variety of foods, such as meat or vegetable-based protein, fish, milk and low-fat dairy products, vegetables, fruits, and whole grain breads and cereals. Your health care provider will help you determine the amount of weight gain that is right for you. Avoid raw meat and uncooked cheese. These carry germs that can cause birth defects in the baby. Eating four or five small meals rather than three large meals a day may help relieve nausea and vomiting. If you start to feel nauseous, eating a few soda crackers can be helpful. Drinking liquids between meals instead of during meals also seems to help nausea and vomiting. If you develop constipation, eat more high-fiber foods, such as fresh vegetables or fruit and whole grains. Drink enough fluids to keep your urine clear or pale yellow. Activity and Exercise Exercise only as directed by your health care provider. Exercising will help you: Control your weight. Stay in shape. Be prepared for labor and delivery. Experiencing pain or cramping in the lower abdomen or low back is a good sign that you should stop exercising. Check with your health care provider before continuing normal exercises. Try to avoid standing for long periods of time. Move your legs often if you must stand in one place for a long time. Avoid heavy lifting. Wear low-heeled shoes, and practice good posture. You may continue to have sex unless your health care provider directs you otherwise. Relief of Pain or Discomfort Wear a good support bra for breast tenderness.   Take warm sitz baths to soothe any pain or discomfort caused by  hemorrhoids. Use hemorrhoid cream if your health care provider approves.   Rest with your legs elevated if you have leg cramps or low back pain. If you develop varicose veins in your legs, wear support hose. Elevate your feet for 15 minutes, 3-4 times a day. Limit salt in your diet. Prenatal Care Schedule your prenatal visits by the twelfth week of pregnancy. They are usually scheduled monthly at first, then more often in the last 2 months before delivery. Write down your questions. Take them to your prenatal visits. Keep all your prenatal visits as directed  by your health care provider. Safety Wear your seat belt at all times when driving. Make a list of emergency phone numbers, including numbers for family, friends, the hospital, and police and fire departments. General Tips Ask your health care provider for a referral to a local prenatal education class. Begin classes no later than at the beginning of month 6 of your pregnancy. Ask for help if you have counseling or nutritional needs during pregnancy. Your health care provider can offer advice or refer you to specialists for help with various needs. Do not use hot tubs, steam rooms, or saunas. Do not douche or use tampons or scented sanitary pads. Do not cross your legs for long periods of time. Avoid cat litter boxes and soil used by cats. These carry germs that can cause birth defects in the baby and possibly loss of the fetus by miscarriage or stillbirth. Avoid all smoking, herbs, alcohol, and medicines not prescribed by your health care provider. Chemicals in these affect the formation and growth of the baby. Schedule a dentist appointment. At home, brush your teeth with a soft toothbrush and be gentle when you floss. SEEK MEDICAL CARE IF:  You have dizziness. You have mild pelvic cramps, pelvic pressure, or nagging pain in the abdominal area. You have persistent nausea, vomiting, or diarrhea. You have a bad smelling vaginal  discharge. You have pain with urination. You notice increased swelling in your face, hands, legs, or ankles. SEEK IMMEDIATE MEDICAL CARE IF:  You have a fever. You are leaking fluid from your vagina. You have spotting or bleeding from your vagina. You have severe abdominal cramping or pain. You have rapid weight gain or loss. You vomit blood or material that looks like coffee grounds. You are exposed to [place] measles and have never had them. You are exposed to fifth disease or chickenpox. You develop a severe headache. You have shortness of breath. You have any kind of trauma, such as from a fall or a car accident. Document Released: 03/27/2001 Document Revised: 08/17/2013 Document Reviewed: 02/10/2013 [facility] Patient Information 2015 [place], [place]. This information is not intended to replace advice given to you by your health care provider. Make sure you discuss any questions you have with your health care provider.  Coronavirus (COVID-19) Are you at risk?  Are you at risk for the Coronavirus (COVID-19)?  To be considered HIGH RISK for Coronavirus (COVID-19), you have to meet the following criteria:  Traveled to [place], [place], [place], [place] or [place];  and have fever, cough, and shortness of breath within the last 2 weeks of travel OR Been in close contact with a person diagnosed with COVID-19 within the last 2 weeks and have fever, cough, and shortness of breath IF YOU DO NOT MEET THESE CRITERIA, YOU ARE CONSIDERED LOW RISK FOR COVID-19.  What to do if you are HIGH RISK for COVID-19?  If you are having a medical emergency, call 911. Seek medical care right away. Before you go to a doctor's office, urgent care or emergency department, call ahead and tell them about your recent travel, contact with someone diagnosed with COVID-19, and your symptoms. You should receive instructions from your physician's office regarding next steps of care.  When you arrive at healthcare provider,  tell the healthcare staff immediately you have returned from visiting [place], [place], [place], [place] or [place]; in the last two weeks or you have been in close contact with a person diagnosed with COVID-19 in the last 2 weeks.  Tell the health care staff about your symptoms: fever, cough and shortness of breath. After you have been seen by a medical provider, you will be either: Tested for (COVID-19) and discharged home on quarantine except to seek medical care if symptoms worsen, and asked to  Stay home and avoid contact with others until you get your results (4-5 days)  Avoid travel on public transportation if possible (such as bus, train, or airplane) or Sent to the Emergency Department by EMS for evaluation, COVID-19 testing, and possible admission depending on your condition and test results.  What to do if you are LOW RISK for COVID-19?  Reduce your risk of any infection by using the same precautions used for avoiding the common cold or flu:  Wash your hands often with soap and warm water for at least 20 seconds.  If soap and water are not readily available, use an alcohol-based hand sanitizer with at least 60% alcohol.  If coughing or sneezing, cover your mouth and nose by coughing or sneezing into the elbow areas of your shirt or coat, into a tissue or into your sleeve (not your hands). Avoid shaking hands with others and consider head nods or verbal greetings only. Avoid touching your eyes, nose, or mouth with unwashed hands.  Avoid close contact with people who are sick. Avoid places or events with large numbers of people in one location, like concerts or sporting events. Carefully consider travel plans you have or are making. If you are planning any travel outside or inside the [place], visit the CDC's Travelers' Health webpage for the latest health notices. If you have some symptoms but not all symptoms, continue to monitor at home and seek medical attention if your symptoms worsen. If  you are having a medical emergency, call 911.   ADDITIONAL HEALTHCARE OPTIONS FOR PATIENTS  [facility] / e-Visit: [email or web address]         [facility]: [phone]  [facility]: [phone]                   [facility]: [phone]     Safe Medications in Pregnancy   Acne: Benzoyl Peroxide Salicylic Acid  Backache/Headache: Tylenol: 2 regular strength every 4 hours OR              2 Extra strength every 6 hours  Colds/Coughs/Allergies/Congestion: Benadryl (alcohol free) 25 mg every 6 hours as needed Breath right strips Claritin Cepacol throat lozenges Chloraseptic throat spray Cold-Eeze- up to three times per day Cough drops, alcohol free Flonase (by prescription only) Guaifenesin Mucinex Robitussin DM (plain only, alcohol free) Saline nasal spray/drops Sudafed (pseudoephedrine) & Actifed ** use only after [redacted] weeks gestation and if you do not have high blood pressure Tylenol Vicks Vaporub Zinc lozenges Zyrtec   Constipation: Colace Ducolax suppositories Fleet enema Glycerin suppositories Metamucil Milk of magnesia Miralax Senokot Smooth move tea  Diarrhea: Kaopectate Imodium A-D  *NO pepto Bismol  Hemorrhoids: Anusol Anusol HC Preparation H Tucks  Indigestion: Tums Maalox Mylanta Zantac  Pepcid  Insomnia: Benadryl (alcohol free) 25mg  every 6 hours as needed Tylenol PM Unisom, no Gelcaps  Leg Cramps: Tums MagGel  Nausea/Vomiting:  Bonine Dramamine Emetrol Ginger extract Sea bands Meclizine  Nausea medication to take during pregnancy:  Unisom (doxylamine succinate 25 mg tablets) Take one tablet daily at bedtime. If symptoms are not adequately controlled, the dose can be increased to a maximum recommended dose of two tablets daily (1/2 tablet in  the morning, 1/2 tablet mid-afternoon and one at bedtime). Vitamin B6 100mg  tablets.  Take one tablet twice a day (up to 200 mg per day).  Skin Rashes: Aveeno products Benadryl cream or 25mg  every 6 hours as needed Calamine Lotion 1% cortisone cream  Yeast infection: Gyne-lotrimin 7 Monistat 7   **If taking multiple medications, please check labels to avoid duplicating the same active ingredients **take medication as directed on the label ** Do not exceed 4000 mg of tylenol in 24 hours **Do not take medications that contain aspirin or ibuprofen

## 2022-02-15 NOTE — Progress Notes (Addendum)
[place] 12+4 wks,measurements c/w dates,CRL 60.52 mm,FHR 137 bpm,NT 1.4 mm,NB present,normal ovaries,subchorionic hemorrhage 3.7 x 2.8 x 1 cm

## 2022-02-15 NOTE — Progress Notes (Signed)
INITIAL OBSTETRICAL VISIT Patient name: Angelica Ramirez MRN [id number]  Date of birth: 28-Nov-1995 Chief Complaint:   Initial Prenatal Visit (Headaches, nasal congestion)  History of Present Illness:   Angelica Ramirez is a 26 y.o. G39P1001 African American female at [redacted]w[redacted]d by [place] at 8 weeks with an Estimated Date of Delivery: 08/26/22 being seen today for her initial obstetrical visit.   Her obstetrical history is significant for term SVD w/o problems.   Today she reports some HA and nasal congestion. Nausea is better, drinking lemon water.     02/15/2022    2:19 PM 11/22/2016   10:05 AM  Depression screen PHQ 2/9  Decreased Interest 0 2  Down, Depressed, Hopeless 0 1  PHQ - 2 Score 0 3  Altered sleeping 1 2  Tired, decreased energy 1 2  Change in appetite 0 3  Feeling bad or failure about yourself  0 0  Trouble concentrating 0 0  Moving slowly or fidgety/restless 0 0  Suicidal thoughts 0 0  PHQ-9 Score 2 10    Patient's last menstrual period was 11/29/2021 (approximate). Last pap >3 years. Results were: normal Review of Systems:   Pertinent items are noted in HPI Denies cramping/contractions, leakage of fluid, vaginal bleeding, abnormal vaginal discharge w/ itching/odor/irritation, headaches, visual changes, shortness of breath, chest pain, abdominal pain, severe nausea/vomiting, or problems with urination or bowel movements unless otherwise stated above.  Pertinent History Reviewed:  Reviewed past medical,surgical, social, obstetrical and family history.  Reviewed problem list, medications and allergies. OB History  Gravida Para Term Preterm AB Living  2 1 1  0 0 1  SAB IAB Ectopic Multiple Live Births  0 0 0 0 1    # Outcome Date GA Lbr Len/2nd Weight Sex Delivery Anes PTL Lv  2 Current           1 Term 06/19/17 [redacted]w[redacted]d 12:45 / 07:08 7 lb 8.1 oz (3.405 kg) F Vag-Spont EPI N LIV     Birth CommentsPatriciaann Clan 0[birth year]   [id number]   Physical Assessment:   Vitals:   02/15/22 1416  BP:  129/74  Pulse: (!) 102  Weight: 196 lb (88.9 kg)  Body mass index is 31.64 kg/m.       Physical Examination:  General appearance - well appearing, and in no distress  Mental status - alert, oriented to person, place, and time  Psych:  She has a normal mood and affect  Skin - warm and dry, normal color, no suspicious lesions noted  Chest - effort normal  Heart - normal rate and regular rhythm  Abdomen - soft, nontender  Extremities:  No swelling or varicosities noted  Pelvic - VULVA: normal appearing vulva with no masses, tenderness or lesions  VAGINA: normal appearing vagina with normal color and discharge, no lesions  CERVIX: normal appearing cervix without discharge or lesions, no CMT  Thin prep pap is done with HR HPV cotesting  TODAY'S NT [place] 12+4 wks,measurements c/w dates,CRL 60.52 mm,FHR 137 bpm,NT 1.4 mm,NB present,normal ovaries,subchorionic hemorrhage 3.7 x 2.8 x 1 cm   Results for orders placed or performed in visit on 02/15/22 (from the past 24 hour(s))  POC Urinalysis Dipstick OB   Collection Time: 02/15/22  2:35 PM  Result Value Ref Range   Color, UA     Clarity, UA     Glucose, UA Moderate (2+) (A) Negative   Bilirubin, UA     Ketones, UA neg    Spec Grav, UA  Blood, UA neg    pH, UA     POC,PROTEIN,UA Trace Negative, Trace, Small (1+), Moderate (2+), Large (3+), 4+   Urobilinogen, UA     Nitrite, UA neg    Leukocytes, UA Negative Negative   Appearance     Odor      Assessment & Plan:  1) Low-Risk Pregnancy G2P1001 at [redacted]w[redacted]d with an Estimated Date of Delivery: 08/26/22   2) Initial OB visit  3) HA/Congestion:  med list given  Meds: No orders of the defined types were placed in this encounter.   Initial labs obtained Continue prenatal vitamins Reviewed n/v relief measures and warning s/s to report Reviewed recommended weight gain based on pre-gravid BMI Encouraged well-balanced diet Genetic & carrier screening discussed: requests Panorama, NT/IT,  and Horizon , declines AFP Ultrasound discussed; fetal survey: requested CCNC completed> form faxed if has or is planning to apply for medicaid The nature of [place] [facility] [employer] with multiple MDs and other Advanced Practice Providers was explained to patient; also emphasized that fellows, residents, and students are part of our team. Has home bp cuff. Check bp weekly, let us know if >140/90.        Jacklyn Shell 2:47 PM

## 2022-02-16 LAB — INTEGRATED 1

## 2022-02-16 LAB — CBC/D/PLT+RPR+RH+ABO+RUBIGG...
Lymphs: 17 %
Neutrophils: 72 %

## 2022-02-17 LAB — CBC/D/PLT+RPR+RH+ABO+RUBIGG...
Antibody Screen: NEGATIVE
Basophils Absolute: 0 10*3/uL (ref 0.0–0.2)
Basos: 0 %
EOS (ABSOLUTE): 0.2 10*3/uL (ref 0.0–0.4)
Eos: 2 %
HCV Ab: NONREACTIVE
HIV Screen 4th Generation wRfx: NONREACTIVE
Hematocrit: 35.9 % (ref 34.0–46.6)
Hemoglobin: 12.2 g/dL (ref 11.1–15.9)
Hepatitis B Surface Ag: NEGATIVE
Immature Grans (Abs): 0 10*3/uL (ref 0.0–0.1)
Immature Granulocytes: 0 %
Lymphocytes Absolute: 1.9 10*3/uL (ref 0.7–3.1)
MCH: 28.6 pg (ref 26.6–33.0)
MCHC: 34 g/dL (ref 31.5–35.7)
MCV: 84 fL (ref 79–97)
Monocytes Absolute: 1 10*3/uL — ABNORMAL HIGH (ref 0.1–0.9)
Monocytes: 9 %
Neutrophils Absolute: 8 10*3/uL — ABNORMAL HIGH (ref 1.4–7.0)
Platelets: 217 10*3/uL (ref 150–450)
RBC: 4.26 x10E6/uL (ref 3.77–5.28)
RDW: 13 % (ref 11.7–15.4)
RPR Ser Ql: NONREACTIVE
Rh Factor: POSITIVE
Rubella Antibodies, IGG: 3.1 index (ref >0.99)
WBC: 11.2 10*3/uL — ABNORMAL HIGH (ref 3.4–10.8)

## 2022-02-17 LAB — INTEGRATED 1
Crown Rump Length: 60.5 mm
Gest. Age on Collection Date: 12.3 weeks
Maternal Age at EDD: 25.7 yr
Nuchal Translucency (NT): 1.4 mm
Number of Fetuses: 1
PAPP-A Value: 3201.3 ng/mL

## 2022-02-17 LAB — HEMOGLOBIN A1C
Est. average glucose Bld gHb Est-mCnc: 100 mg/dL
Hgb A1c MFr Bld: 5.1 % (ref 4.8–5.6)

## 2022-02-17 LAB — HCV INTERPRETATION

## 2022-02-18 LAB — URINE CULTURE

## 2022-02-21 LAB — CYTOLOGY - PAP
Chlamydia: NEGATIVE
Comment: NEGATIVE
Comment: NEGATIVE
Comment: NORMAL
Diagnosis: UNDETERMINED — AB
High risk HPV: NEGATIVE
Neisseria Gonorrhea: NEGATIVE

## 2022-02-22 LAB — PANORAMA PRENATAL TEST FULL PANEL:PANORAMA TEST PLUS 5 ADDITIONAL MICRODELETIONS: FETAL FRACTION: 12.7

## 2022-03-04 LAB — HORIZON CUSTOM: REPORT SUMMARY: POSITIVE — AB

## 2022-03-15 ENCOUNTER — Ambulatory Visit (INDEPENDENT_AMBULATORY_CARE_PROVIDER_SITE_OTHER): Payer: 59 | Admitting: Advanced Practice Midwife

## 2022-03-15 ENCOUNTER — Encounter: Payer: Self-pay | Admitting: Advanced Practice Midwife

## 2022-03-15 VITALS — BP 105/72 | HR 98 | Wt 200.0 lb

## 2022-03-15 DIAGNOSIS — Z3A16 16 weeks gestation of pregnancy: Secondary | ICD-10-CM

## 2022-03-15 DIAGNOSIS — Z3480 Encounter for supervision of other normal pregnancy, unspecified trimester: Secondary | ICD-10-CM

## 2022-03-15 DIAGNOSIS — Z363 Encounter for antenatal screening for malformations: Secondary | ICD-10-CM

## 2022-03-15 NOTE — Patient Instructions (Signed)
Angelica Ramirez, I greatly value your feedback.  If you receive a survey following your visit with [place] today, we appreciate you taking the time to fill it out.  Thanks, Cathie Beams, CNM     [facility] HAS MOVED!!! It is now [facility] ([address] [place], [place] [address]) Entrance located off of [address] Free 24/7 valet parking   Go to [email or web address] to register for FREE online childbirth classes    Second Trimester of Pregnancy The second trimester is from week 14 through week 27 (months 4 through 6). The second trimester is often a time when you feel your best. Your body has adjusted to being pregnant, and you begin to feel better physically. Usually, morning sickness has lessened or quit completely, you may have more energy, and you may have an increase in appetite. The second trimester is also a time when the fetus is growing rapidly. At the end of the sixth month, the fetus is about 9 inches long and weighs about 1 pounds. You will likely begin to feel the baby move (quickening) between 16 and 20 weeks of pregnancy. Body changes during your second trimester Your body continues to go through many changes during your second trimester. The changes vary from woman to woman. Your weight will continue to increase. You will notice your lower abdomen bulging out. You may begin to get stretch marks on your hips, abdomen, and breasts. You may develop headaches that can be relieved by medicines. The medicines should be approved by your health care provider. You may urinate more often because the fetus is pressing on your bladder. You may develop or continue to have heartburn as a result of your pregnancy. You may develop constipation because certain hormones are causing the muscles that push waste through your intestines to slow down. You may develop hemorrhoids or swollen, bulging veins (varicose veins). You may have back pain. This is  caused by: Weight gain. Pregnancy hormones that are relaxing the joints in your pelvis. A shift in weight and the muscles that support your balance. Your breasts will continue to grow and they will continue to become tender. Your gums may bleed and may be sensitive to brushing and flossing. Dark spots or blotches (chloasma, mask of pregnancy) may develop on your face. This will likely fade after the baby is born. A dark line from your belly button to the pubic area (linea nigra) may appear. This will likely fade after the baby is born. You may have changes in your hair. These can include thickening of your hair, rapid growth, and changes in texture. Some women also have hair loss during or after pregnancy, or hair that feels dry or thin. Your hair will most likely return to normal after your baby is born.  What to expect at prenatal visits During a routine prenatal visit: You will be weighed to make sure you and the fetus are growing normally. Your blood pressure will be taken. Your abdomen will be measured to track your baby's growth. The fetal heartbeat will be listened to. Any test results from the previous visit will be discussed.  Your health care provider may ask you: How you are feeling. If you are feeling the baby move. If you have had any abnormal symptoms, such as leaking fluid, bleeding, severe headaches, or abdominal cramping. If you are using any tobacco products, including cigarettes, chewing tobacco, and electronic cigarettes. If you have any questions.  Other tests  that may be performed during your second trimester include: Blood tests that check for: Low iron levels (anemia). High blood sugar that affects pregnant women (gestational diabetes) between 54 and 28 weeks. Rh antibodies. This is to check for a protein on red blood cells (Rh factor). Urine tests to check for infections, diabetes, or protein in the urine. An ultrasound to confirm the proper growth and  development of the baby. An amniocentesis to check for possible genetic problems. Fetal screens for spina bifida and Down syndrome. HIV (human immunodeficiency virus) testing. Routine prenatal testing includes screening for HIV, unless you choose not to have this test.  Follow these instructions at home: Medicines Follow your health care provider's instructions regarding medicine use. Specific medicines may be either safe or unsafe to take during pregnancy. Take a prenatal vitamin that contains at least 600 micrograms (mcg) of folic acid. If you develop constipation, try taking a stool softener if your health care provider approves. Eating and drinking Eat a balanced diet that includes fresh fruits and vegetables, whole grains, good sources of protein such as meat, eggs, or tofu, and low-fat dairy. Your health care provider will help you determine the amount of weight gain that is right for you. Avoid raw meat and uncooked cheese. These carry germs that can cause birth defects in the baby. If you have low calcium intake from food, talk to your health care provider about whether you should take a daily calcium supplement. Limit foods that are high in fat and processed sugars, such as fried and sweet foods. To prevent constipation: Drink enough fluid to keep your urine clear or pale yellow. Eat foods that are high in fiber, such as fresh fruits and vegetables, whole grains, and beans. Activity Exercise only as directed by your health care provider. Most women can continue their usual exercise routine during pregnancy. Try to exercise for 30 minutes at least 5 days a week. Stop exercising if you experience uterine contractions. Avoid heavy lifting, wear low heel shoes, and practice good posture. A sexual relationship may be continued unless your health care provider directs you otherwise. Relieving pain and discomfort Wear a good support bra to prevent discomfort from breast tenderness. Take  warm sitz baths to soothe any pain or discomfort caused by hemorrhoids. Use hemorrhoid cream if your health care provider approves. Rest with your legs elevated if you have leg cramps or low back pain. If you develop varicose veins, wear support hose. Elevate your feet for 15 minutes, 3-4 times a day. Limit salt in your diet. Prenatal Care Write down your questions. Take them to your prenatal visits. Keep all your prenatal visits as told by your health care provider. This is important. Safety Wear your seat belt at all times when driving. Make a list of emergency phone numbers, including numbers for family, friends, the hospital, and police and fire departments. General instructions Ask your health care provider for a referral to a local prenatal education class. Begin classes no later than the beginning of month 6 of your pregnancy. Ask for help if you have counseling or nutritional needs during pregnancy. Your health care provider can offer advice or refer you to specialists for help with various needs. Do not use hot tubs, steam rooms, or saunas. Do not douche or use tampons or scented sanitary pads. Do not cross your legs for long periods of time. Avoid cat litter boxes and soil used by cats. These carry germs that can cause birth defects in the baby  and possibly loss of the fetus by miscarriage or stillbirth. Avoid all smoking, herbs, alcohol, and unprescribed drugs. Chemicals in these products can affect the formation and growth of the baby. Do not use any products that contain nicotine or tobacco, such as cigarettes and e-cigarettes. If you need help quitting, ask your health care provider. Visit your dentist if you have not gone yet during your pregnancy. Use a soft toothbrush to brush your teeth and be gentle when you floss. Contact a health care provider if: You have dizziness. You have mild pelvic cramps, pelvic pressure, or nagging pain in the abdominal area. You have persistent  nausea, vomiting, or diarrhea. You have a bad smelling vaginal discharge. You have pain when you urinate. Get help right away if: You have a fever. You are leaking fluid from your vagina. You have spotting or bleeding from your vagina. You have severe abdominal cramping or pain. You have rapid weight gain or weight loss. You have shortness of breath with chest pain. You notice sudden or extreme swelling of your face, hands, ankles, feet, or legs. You have not felt your baby move in over an hour. You have severe headaches that do not go away when you take medicine. You have vision changes. Summary The second trimester is from week 14 through week 27 (months 4 through 6). It is also a time when the fetus is growing rapidly. Your body goes through many changes during pregnancy. The changes vary from woman to woman. Avoid all smoking, herbs, alcohol, and unprescribed drugs. These chemicals affect the formation and growth your baby. Do not use any tobacco products, such as cigarettes, chewing tobacco, and e-cigarettes. If you need help quitting, ask your health care provider. Contact your health care provider if you have any questions. Keep all prenatal visits as told by your health care provider. This is important. This information is not intended to replace advice given to you by your health care provider. Make sure you discuss any questions you have with your health care provider.

## 2022-03-15 NOTE — Progress Notes (Signed)
   LOW-RISK PREGNANCY VISIT Patient name: MONIFA BLANCHETTE MRN [id number]  Date of birth: [birth year] Chief Complaint:   Routine Prenatal Visit (2nd IT)  History of Present Illness:   Angelica Ramirez is a 26 y.o. G40P1001 female at [redacted]w[redacted]d with an Estimated Date of Delivery: 08/26/22 being seen today for ongoing management of a low-risk pregnancy.  Today she reports no complaints. Contractions: Not present.  .  Movement: Absent. denies leaking of fluid. Review of Systems:   Pertinent items are noted in HPI Denies abnormal vaginal discharge w/ itching/odor/irritation, headaches, visual changes, shortness of breath, chest pain, abdominal pain, severe nausea/vomiting, or problems with urination or bowel movements unless otherwise stated above. Pertinent History Reviewed:  Reviewed past medical,surgical, social, obstetrical and family history.  Reviewed problem list, medications and allergies. Physical Assessment:   Vitals:   03/15/22 1348  BP: 105/72  Pulse: 98  Weight: 200 lb (90.7 kg)  Body mass index is 32.28 kg/m.        Physical Examination:   General appearance: Well appearing, and in no distress  Mental status: Alert, oriented to person, place, and time  Skin: Warm & dry  Cardiovascular: Normal heart rate noted  Respiratory: Normal respiratory effort, no distress  Abdomen: Soft, gravid, nontender  Pelvic: Cervical exam deferred         Extremities: Edema: None  Fetal Status: Fetal Heart Rate (bpm): 152   Movement: Absent    Chaperone:  N/A    No results found for this or any previous visit (from the past 24 hour(s)).  Assessment & Plan:  1) Low-risk pregnancy G2P1001 at [redacted]w[redacted]d with an Estimated Date of Delivery: 08/26/22      Meds: No orders of the defined types were placed in this encounter.  Labs/procedures today: 2nd IT  Plan:  Continue routine obstetrical care  Next visit: prefers will be in person for anatomy scan     Reviewed general obstetric precautions including  but not limited to vaginal bleeding, contractions, leaking of fluid and fetal movement were reviewed in detail with the patient.  All questions were answered. Has home bp cuff.. Check bp weekly, let us know if >140/90.   Follow-up: Return in about 3 weeks (around 04/05/2022) for PJ:ASNKNLZ.  No future appointments.  Orders Placed This Encounter  Procedures   US OB Comp + 14 Wk   INTEGRATED 2   Jacklyn Shell DNP, CNM 03/15/2022 2:21 PM\

## 2022-03-17 LAB — INTEGRATED 2
AFP MoM: 2.75
Alpha-Fetoprotein: 82.3 ng/mL
Crown Rump Length: 60.5 mm
DIA MoM: 0.54
DIA Value: 70.5 pg/mL
Estriol, Unconjugated: 1.2 ng/mL
Gest. Age on Collection Date: 12.3 weeks
Gestational Age: 16.3 weeks
Maternal Age at EDD: 25.7 yr
Nuchal Translucency (NT): 1.4 mm
Nuchal Translucency MoM: 1.04
Number of Fetuses: 1
PAPP-A MoM: 4.69
PAPP-A Value: 3201.3 ng/mL
Test Results:: NEGATIVE
Weight: 196 [lb_av]
Weight: 196 [lb_av]
hCG MoM: 2.23
hCG Value: 66.1 IU/mL
uE3 MoM: 1.3

## 2022-03-23 ENCOUNTER — Encounter (HOSPITAL_COMMUNITY): Payer: Self-pay | Admitting: *Deleted

## 2022-03-23 ENCOUNTER — Emergency Department (HOSPITAL_COMMUNITY): Payer: 59

## 2022-03-23 ENCOUNTER — Other Ambulatory Visit: Payer: Self-pay

## 2022-03-23 ENCOUNTER — Telehealth: Payer: Self-pay | Admitting: *Deleted

## 2022-03-23 ENCOUNTER — Emergency Department (HOSPITAL_COMMUNITY)
Admission: EM | Admit: 2022-03-23 | Discharge: 2022-03-23 | Disposition: A | Discharge status: Home / Self Care | Payer: 59 | Attending: Emergency Medicine | Admitting: Emergency Medicine

## 2022-03-23 DIAGNOSIS — Z3A17 17 weeks gestation of pregnancy: Secondary | ICD-10-CM | POA: Diagnosis not present

## 2022-03-23 DIAGNOSIS — N939 Abnormal uterine and vaginal bleeding, unspecified: Secondary | ICD-10-CM

## 2022-03-23 DIAGNOSIS — O26852 Spotting complicating pregnancy, second trimester: Secondary | ICD-10-CM | POA: Diagnosis not present

## 2022-03-23 DIAGNOSIS — O209 Hemorrhage in early pregnancy, unspecified: Secondary | ICD-10-CM | POA: Diagnosis not present

## 2022-03-23 NOTE — ED Notes (Signed)
Pt c/o intermittent light pink spotting x 3 weeks. States last few days it has been more with brown and  "one spot was bright red'. Has not felt baby move at all yet. [place] at 12 weeks showed subchorionic hemorrhage, pt states she is worried and just wants to make sure that is not any worse. Pt denies any pain at this time.

## 2022-03-23 NOTE — ED Notes (Signed)
Pt refused blood work and wanted to be discharged. PA aware

## 2022-03-23 NOTE — ED Provider Notes (Signed)
[facility] EMERGENCY DEPARTMENT Provider Note   CSN: [id number] Arrival date & time: 03/23/22  4403     History  Chief Complaint  Patient presents with   Vaginal Bleeding   HPI Angelica Ramirez is a 26 y.o. female currently [redacted] weeks pregnant presenting for vaginal bleeding.  Started 3 weeks ago.  Was diagnosed with subchorionic hemorrhage discovered on recent OB ultrasound.  Yesterday she noticed brown vaginal discharge and also some light pink spotting.  Patient denies abdominal pain.  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  She is concerned that with the ongoing spotting that the subchorionic hemorrhage is getting worse.  Patient denies shortness of breath, fatigue, or lightheadedness.   Vaginal Bleeding      Home Medications Prior to Admission medications   Medication Sig Start Date End Date Taking? Authorizing Provider  Doxylamine-Pyridoxine 10-10 MG TBEC Take 2 tablets by mouth every evening. Patient not taking: Reported on 03/15/2022 12/20/21   Particia Nearing, PA-C  ondansetron (ZOFRAN) 4 MG tablet Take 1 tablet (4 mg total) by mouth every 8 (eight) hours as needed for nausea or vomiting. Patient not taking: Reported on 03/15/2022 01/04/22   Freddy Finner, NP  Prenatal Vit-Fe Fumarate-FA (PRENATAL VITAMIN PO) Take by mouth.    [provider]      Allergies    Patient has no known allergies.    Review of Systems   Review of Systems  Genitourinary:  Positive for vaginal bleeding.    Physical Exam Updated Vital Signs BP 135/74   Pulse (!) 101   Temp 98.2 F (36.8 C) (Oral)   Resp 18   Ht 5\' 6"  (1.676 m)   Wt 90.7 kg   LMP 11/29/2021 (Approximate)   SpO2 99%   BMI 32.28 kg/m  Physical Exam Vitals and nursing note reviewed.  HENT:     Head: Normocephalic and atraumatic.     Mouth/Throat:     Mouth: Mucous membranes are moist.  Eyes:     General:        Right eye:  No discharge.        Left eye: No discharge.     Conjunctiva/sclera: Conjunctivae normal.  Cardiovascular:     Rate and Rhythm: Normal rate and regular rhythm.     Pulses: Normal pulses.     Heart sounds: Normal heart sounds.  Pulmonary:     Effort: Pulmonary effort is normal.     Breath sounds: Normal breath sounds.  Abdominal:     General: Abdomen is flat.     Palpations: Abdomen is soft.  Skin:    General: Skin is warm and dry.  Neurological:     General: No focal deficit present.  Psychiatric:        Mood and Affect: Mood normal.     ED Results / Procedures / Treatments   Labs (all labs ordered are listed, but only abnormal results are displayed) Labs Reviewed  HCG, QUANTITATIVE, PREGNANCY  CBC  BASIC METABOLIC PANEL  URINALYSIS, ROUTINE W REFLEX MICROSCOPIC    EKG None  Radiology No results found.  Procedures Procedures    Medications Ordered in ED Medications - No data to display  ED Course/ Medical Decision Making/ A&P                           Medical Decision  Making  26-yo female currently [redacted] wks pregnant well appearing and non-toxic presenting for vaginal appearing.  Differential diagnosis for this complaint includes miscarriage, subchorionic hemorrhage, and ectopic pregnancy.  Per chart review, vaginal ultrasound in November revealed revealed viable pregnancy and evidence of subchorionic hemorrhage.  Patient was concerned that the subchorionic hemorrhage may be getting bigger since she still having vaginal bleeding.  I ordered blood work including hCG quantitative to assess whether or not patient is having active miscarriage.  We discussed it would likely be more appropriate for her to follow-up with her OB and have another ultrasound at that point.  Patient requested that she have the ultrasound here.  The ultrasound given patient's concerns.  However patient left AMA without full evaluation.        Final Clinical Impression(s) / ED  Diagnoses Final diagnoses:  Vaginal bleeding    Rx / DC Orders ED Discharge Orders     None         Gareth Eagle, PA-C 03/23/22 1000    Vanetta Mulders, MD 03/31/22 1720

## 2022-03-23 NOTE — Telephone Encounter (Signed)
Patient called reporting noting dark brown blood last night and then slight red today.  States she has recently had intercourse as well. Denies abdominal pain. Informed patient she has a know [facility] and at times it may bleed dark brown or red as she has to either bleed it out or absorb the blood.  Advised no intercourse for 7 days and to monitor. If bleeding becomes heavy or she develops abdominal pain, to call us or [facility]. Pt verbalized understanding.

## 2022-03-23 NOTE — ED Triage Notes (Signed)
Pt states she noticed some vaginal bleeding last night; pt states at times the discharge would be brown in color but last night after work she would notice light pink; pt denies any abdominal pain just lower back pain  Yesterday was pt's first day back at work after 3 months and she is continuously on her feet

## 2022-03-23 NOTE — ED Notes (Signed)
Pt walked out, called her name and pt kept walking.

## 2022-03-27 ENCOUNTER — Encounter: Payer: Self-pay | Admitting: Advanced Practice Midwife

## 2022-03-28 ENCOUNTER — Other Ambulatory Visit (INDEPENDENT_AMBULATORY_CARE_PROVIDER_SITE_OTHER): Payer: 59

## 2022-03-28 ENCOUNTER — Other Ambulatory Visit (HOSPITAL_COMMUNITY)
Admission: RE | Admit: 2022-03-28 | Discharge: 2022-03-28 | Disposition: A | Discharge status: Home / Self Care | Payer: 59 | Source: Ambulatory Visit | Attending: Obstetrics & Gynecology | Admitting: Obstetrics & Gynecology

## 2022-03-28 VITALS — BP 114/70 | HR 97

## 2022-03-28 DIAGNOSIS — N898 Other specified noninflammatory disorders of vagina: Secondary | ICD-10-CM | POA: Diagnosis not present

## 2022-03-28 DIAGNOSIS — Z3480 Encounter for supervision of other normal pregnancy, unspecified trimester: Secondary | ICD-10-CM

## 2022-03-28 NOTE — Progress Notes (Signed)
   NURSE VISIT- VAGINITIS  SUBJECTIVE:  Angelica Ramirez is a 26 y.o. G2P1001 [redacted]w[redacted]d pregnantfemale here for a vaginal swab for vaginitis screening.  She reports the following symptoms: discharge described as yellow/brown, odor, and light vaginal spotting  for a couple months. Denies significant pelvic pain, fever, or UTI symptoms.  OBJECTIVE:  BP 114/70   Pulse 97   LMP 11/29/2021 (Approximate)   Appears well, in no apparent distress  ASSESSMENT: Vaginal swab for vaginitis screening  PLAN: Self-collected vaginal probe for Gonorrhea, Chlamydia, Trichomonas, Bacterial Vaginosis, Yeast sent to lab Treatment: to be determined once results are received Follow-up as needed if symptoms persist/worsen, or new symptoms develop  Jobe Marker  03/28/2022 4:11 PM

## 2022-03-30 LAB — CERVICOVAGINAL ANCILLARY ONLY
Bacterial Vaginitis (gardnerella): POSITIVE — AB
Candida Glabrata: NEGATIVE
Candida Vaginitis: NEGATIVE
Chlamydia: NEGATIVE
Comment: NEGATIVE
Comment: NEGATIVE
Comment: NEGATIVE
Comment: NEGATIVE
Comment: NEGATIVE
Comment: NORMAL
Neisseria Gonorrhea: NEGATIVE
Trichomonas: NEGATIVE

## 2022-04-01 ENCOUNTER — Other Ambulatory Visit: Payer: Self-pay | Admitting: Advanced Practice Midwife

## 2022-04-01 DIAGNOSIS — N760 Acute vaginitis: Secondary | ICD-10-CM

## 2022-04-01 MED ORDER — METRONIDAZOLE 500 MG PO TABS: 500.0000 mg | ORAL_TABLET | Freq: Two times a day (BID) | ORAL | 0 refills | Status: DC

## 2022-04-16 NOTE — L&D Delivery Note (Addendum)
OB/GYN Faculty Practice Delivery Note  Angelica Ramirez is a 27 y.o. W2N5621 s/p VD at [redacted]w[redacted]d. She was admitted for SOL.   ROM: AROM @ 1210 with clear fluid GBS Status:  Positive/-- (04/18 1330) Maximum Maternal Temperature: 98.4  Labor Progress: Initial SVE: 7.5/90/0. AROM completed at 1210, clear fluid. She then progressed to complete.   Delivery Date/Time: 08/07/22 1251 Delivery: Called to room and patient was complete and pushing. Noted fetal heart decelerations with contractions and sometimes late recovery, so vacuum was considered and consented for, however, FHT improved and had quick recovery after every contraction and push. Head delivered direct OA. Loose nuchal cord x1, reduced without difficulty. Shoulder and body delivered in usual fashion. Cord clamped x 2, and cut by provider without delay due to lack of spontaneous cry and low cord pulse. Infant taken to warmer and subsequently improved. Cord blood drawn. Placenta delivered spontaneously with gentle cord traction. Fundus firm with massage and Pitocin. Labia, perineum, vagina, and cervix inspected with no lacerations.  Baby Weight: pending  Placenta: 3 vessel, intact. Sent to L&D Complications: None Lacerations: none EBL: 82 mL Analgesia: Epidural   Infant:  APGAR (1 MIN):  7 APGAR (5 MINS):  9  Vonna Drafts, MD FM PGY1, Faculty Practice Center for [employer], [facility] 08/07/2022, 1:17 PM  ___ GME ATTESTATION:  Evaluation and management procedures were performed by the [facility] Medicine Resident under my supervision. I was immediately available for direct supervision, assistance and direction throughout this encounter.  I also confirm that I have verified the information documented in the resident's note, and that I have also personally reperformed the pertinent components of the physical exam and all of the medical decision making activities.  I have also made any necessary editorial  changes.  Myrtie Hawk, DO OB Fellow, Faculty [facility], Center for [facility] 08/07/2022 2:23 PM

## 2022-04-17 ENCOUNTER — Encounter: Payer: Self-pay | Admitting: Obstetrics & Gynecology

## 2022-04-17 ENCOUNTER — Ambulatory Visit (INDEPENDENT_AMBULATORY_CARE_PROVIDER_SITE_OTHER): Payer: Commercial Managed Care - PPO | Admitting: Obstetrics & Gynecology

## 2022-04-17 ENCOUNTER — Ambulatory Visit (INDEPENDENT_AMBULATORY_CARE_PROVIDER_SITE_OTHER): Payer: Commercial Managed Care - PPO

## 2022-04-17 VITALS — BP 123/79 | HR 94 | Wt 213.5 lb

## 2022-04-17 DIAGNOSIS — O359XX0 Maternal care for (suspected) fetal abnormality and damage, unspecified, not applicable or unspecified: Secondary | ICD-10-CM

## 2022-04-17 DIAGNOSIS — Z3A21 21 weeks gestation of pregnancy: Secondary | ICD-10-CM | POA: Diagnosis not present

## 2022-04-17 DIAGNOSIS — Z3480 Encounter for supervision of other normal pregnancy, unspecified trimester: Secondary | ICD-10-CM

## 2022-04-17 DIAGNOSIS — Z363 Encounter for antenatal screening for malformations: Secondary | ICD-10-CM

## 2022-04-17 NOTE — Progress Notes (Signed)
[place] 98+3 wks,cephalic,posterior fundal placenta with anterior accessory lobe,SVP of fluid 5.3 cm,normal ovaries,cx 4.6 cm,fhr 137 BPM,EFW 458 g 74 g,? Abnormal 3VTV

## 2022-04-20 DIAGNOSIS — O359XX0 Maternal care for (suspected) fetal abnormality and damage, unspecified, not applicable or unspecified: Secondary | ICD-10-CM | POA: Insufficient documentation

## 2022-04-20 NOTE — Progress Notes (Signed)
   LOW-RISK PREGNANCY VISIT Patient name: Angelica Ramirez MRN [id number]  Date of birth: May 26, 1995 Chief Complaint:   Routine Prenatal Visit ([place] today)  History of Present Illness:   Angelica Ramirez is a 27 y.o. G63P1001 female at [redacted]w[redacted]d with an Estimated Date of Delivery: 08/26/22 being seen today for ongoing management of a low-risk pregnancy.     02/15/2022    2:19 PM 11/22/2016   10:05 AM  Depression screen PHQ 2/9  Decreased Interest 0 2  Down, Depressed, Hopeless 0 1  PHQ - 2 Score 0 3  Altered sleeping 1 2  Tired, decreased energy 1 2  Change in appetite 0 3  Feeling bad or failure about yourself  0 0  Trouble concentrating 0 0  Moving slowly or fidgety/restless 0 0  Suicidal thoughts 0 0  PHQ-9 Score 2 10    Today she reports no complaints. Contractions: Irritability. Vag. Bleeding: None.  Movement: Present. denies leaking of fluid. Review of Systems:   Pertinent items are noted in HPI Denies abnormal vaginal discharge w/ itching/odor/irritation, headaches, visual changes, shortness of breath, chest pain, abdominal pain, severe nausea/vomiting, or problems with urination or bowel movements unless otherwise stated above. Pertinent History Reviewed:  Reviewed past medical,surgical, social, obstetrical and family history.  Reviewed problem list, medications and allergies. Physical Assessment:   Vitals:   04/17/22 1519  BP: 123/79  Pulse: 94  Weight: 213 lb 8 oz (96.8 kg)  Body mass index is 34.46 kg/m.        Physical Examination:   General appearance: Well appearing, and in no distress  Mental status: Alert, oriented to person, place, and time  Skin: Warm & dry  Cardiovascular: Normal heart rate noted  Respiratory: Normal respiratory effort, no distress  Abdomen: Soft, gravid, nontender  Pelvic: Cervical exam deferred         Extremities:    Fetal Status:     Movement: Present    Chaperone: n/a    No results found for this or any previous visit (from the past  24 hour(s)).  Assessment & Plan:  1) Low-risk pregnancy G2P1001 at [redacted]w[redacted]d with an Estimated Date of Delivery: 08/26/22   2) Sonogram today reveals an abnormal 3VTV which appears to be a right sided aortic arch, no other abnormalities are seen, refer to pediatric cardiology for further evaluation with ECHO,       ICD-10-CM   1. Supervision of other normal pregnancy, antepartum  Z34.80     2. Right sided fetal aortic arch-->ECHO 24 weeks  O35.9XX0       Meds: No orders of the defined types were placed in this encounter.  Labs/procedures today: sonogram  Plan:  Continue routine obstetrical care  Next visit: prefers       Follow-up: Return in about 4 weeks (around 05/15/2022) for Follow up, with Dr Elonda Husky.  No orders of the defined types were placed in this encounter.   Florian Buff, MD 04/20/2022 1:10 PM

## 2022-04-25 ENCOUNTER — Emergency Department (HOSPITAL_COMMUNITY)
Admission: EM | Admit: 2022-04-25 | Discharge: 2022-04-25 | Disposition: A | Discharge status: Home / Self Care | Payer: Commercial Managed Care - PPO | Attending: Emergency Medicine | Admitting: Emergency Medicine

## 2022-04-25 ENCOUNTER — Encounter (HOSPITAL_COMMUNITY): Payer: Self-pay

## 2022-04-25 DIAGNOSIS — U071 COVID-19: Secondary | ICD-10-CM

## 2022-04-25 DIAGNOSIS — O09892 Supervision of other high risk pregnancies, second trimester: Secondary | ICD-10-CM | POA: Insufficient documentation

## 2022-04-25 DIAGNOSIS — Z3A22 22 weeks gestation of pregnancy: Secondary | ICD-10-CM

## 2022-04-25 DIAGNOSIS — O98512 Other viral diseases complicating pregnancy, second trimester: Secondary | ICD-10-CM | POA: Diagnosis not present

## 2022-04-25 DIAGNOSIS — E860 Dehydration: Secondary | ICD-10-CM | POA: Diagnosis not present

## 2022-04-25 DIAGNOSIS — O99282 Endocrine, nutritional and metabolic diseases complicating pregnancy, second trimester: Secondary | ICD-10-CM | POA: Diagnosis not present

## 2022-04-25 LAB — URINALYSIS, ROUTINE W REFLEX MICROSCOPIC
Bacteria, UA: NONE SEEN
Bilirubin Urine: NEGATIVE
Glucose, UA: 50 mg/dL — AB
Hgb urine dipstick: NEGATIVE
Ketones, ur: 80 mg/dL — AB
Leukocytes,Ua: NEGATIVE
Nitrite: NEGATIVE
Protein, ur: 100 mg/dL — AB
Specific Gravity, Urine: 1.026 (ref 1.005–1.030)
pH: 5 (ref 5.0–8.0)

## 2022-04-25 LAB — RESP PANEL BY RT-PCR (RSV, FLU A&B, COVID)  RVPGX2
Influenza A by PCR: NEGATIVE
Influenza B by PCR: NEGATIVE
Resp Syncytial Virus by PCR: NEGATIVE
SARS Coronavirus 2 by RT PCR: POSITIVE — AB

## 2022-04-25 LAB — CBC WITH DIFFERENTIAL/PLATELET
Abs Immature Granulocytes: 0.1 10*3/uL — ABNORMAL HIGH (ref 0.00–0.07)
Basophils Absolute: 0 10*3/uL (ref 0.0–0.1)
Basophils Relative: 0 %
Eosinophils Absolute: 0 10*3/uL (ref 0.0–0.5)
Eosinophils Relative: 0 %
HCT: 35.5 % — ABNORMAL LOW (ref 36.0–46.0)
Hemoglobin: 11.6 g/dL — ABNORMAL LOW (ref 12.0–15.0)
Immature Granulocytes: 1 %
Lymphocytes Relative: 5 %
Lymphs Abs: 0.6 10*3/uL — ABNORMAL LOW (ref 0.7–4.0)
MCH: 28.1 pg (ref 26.0–34.0)
MCHC: 32.7 g/dL (ref 30.0–36.0)
MCV: 86 fL (ref 80.0–100.0)
Monocytes Absolute: 0.6 10*3/uL (ref 0.1–1.0)
Monocytes Relative: 5 %
Neutro Abs: 10.2 10*3/uL — ABNORMAL HIGH (ref 1.7–7.7)
Neutrophils Relative %: 89 %
Platelets: 227 10*3/uL (ref 150–400)
RBC: 4.13 MIL/uL (ref 3.87–5.11)
RDW: 12.3 % (ref 11.5–15.5)
WBC: 11.5 10*3/uL — ABNORMAL HIGH (ref 4.0–10.5)
nRBC: 0 % (ref 0.0–0.2)

## 2022-04-25 LAB — COMPREHENSIVE METABOLIC PANEL
ALT: 12 U/L (ref 0–44)
AST: 21 U/L (ref 15–41)
Albumin: 3.2 g/dL — ABNORMAL LOW (ref 3.5–5.0)
Alkaline Phosphatase: 63 U/L (ref 38–126)
Anion gap: 11 (ref 5–15)
BUN: 5 mg/dL — ABNORMAL LOW (ref 6–20)
CO2: 21 mmol/L — ABNORMAL LOW (ref 22–32)
Calcium: 8.4 mg/dL — ABNORMAL LOW (ref 8.9–10.3)
Chloride: 100 mmol/L (ref 98–111)
Creatinine, Ser: 0.46 mg/dL (ref 0.44–1.00)
GFR, Estimated: >60 mL/min (ref >60)
Glucose, Bld: 98 mg/dL (ref 70–99)
Potassium: 3.4 mmol/L — ABNORMAL LOW (ref 3.5–5.1)
Sodium: 132 mmol/L — ABNORMAL LOW (ref 135–145)
Total Bilirubin: 0.5 mg/dL (ref 0.3–1.2)
Total Protein: 7 g/dL (ref 6.5–8.1)

## 2022-04-25 LAB — LIPASE, BLOOD: Lipase: 27 U/L (ref 11–51)

## 2022-04-25 MED ORDER — ONDANSETRON 4 MG PO TBDP: 4.0000 mg | ORAL_TABLET | Freq: Three times a day (TID) | ORAL | 0 refills | Status: DC | PRN

## 2022-04-25 MED ORDER — ACETAMINOPHEN 500 MG PO TABS
1000.0000 mg | ORAL_TABLET | Freq: Once | ORAL | Status: AC
  Administered 2022-04-25: 1000 mg via ORAL
  Filled 2022-04-25: qty 2

## 2022-04-25 MED ORDER — ONDANSETRON HCL 4 MG/2ML IJ SOLN
4.0000 mg | Freq: Once | INTRAMUSCULAR | Status: AC
  Administered 2022-04-25: 4 mg via INTRAVENOUS
  Filled 2022-04-25: qty 2

## 2022-04-25 MED ORDER — SODIUM CHLORIDE 0.9 % IV BOLUS
1000.0000 mL | Freq: Once | INTRAVENOUS | Status: AC
  Administered 2022-04-25: 1000 mL via INTRAVENOUS

## 2022-04-25 NOTE — Progress Notes (Signed)
Angelica Ramirez reports clear speculum exam.  Pt d/c from [facility] monitor.

## 2022-04-25 NOTE — Progress Notes (Signed)
Dr Mikel Cella calls back. [facility] ED to perform speculum exam.  As long as this is normal, pt may be OB cleared.

## 2022-04-25 NOTE — ED Triage Notes (Signed)
Pt c/o sharp abdominal pain that started yesterday around 5 pm. Pt reports nausea, vomiting, and diarrhea. Pt is [redacted] weeks pregnant

## 2022-04-25 NOTE — Progress Notes (Signed)
Dr Mikel Cella notified of pt.  MD to call [facility] to see if there is a dr who is comfortable doing a speculum exam before OB clearing her.

## 2022-04-25 NOTE — Progress Notes (Signed)
[facility] RN Nira Conn told that pt can be OB cleared and removed from the monitor with a normal speculum exam.

## 2022-04-25 NOTE — Progress Notes (Signed)
Pt is G2P1 @ [redacted]w[redacted]d who presents with sharp constant abdominal pain that started yesterday around 5pm.  She also complains of N/V/D.  Pt reports positive fetal movement and denies vaginal bleeding and LOF.  ED RN dopplered FHTs in the 150s and placed pt on toco monitor.  RROB will remotely monitor.

## 2022-04-25 NOTE — ED Provider Notes (Signed)
[facility] EMERGENCY DEPARTMENT Provider Note   CSN: [id number] Arrival date & time: 04/25/22  6659     History  Chief Complaint  Patient presents with   Abdominal Pain    Angelica Ramirez is a 27 y.o. female.  Pt is a 27 yo female who is [redacted] weeks pregnant.  She's been getting prenatal care with [facility].  She developed abd pain yesterday around 5 pm.  She has cramping with n/v/d.  Pt denies any fevers.  No known sick contacts.  She is feeling baby move.  No vaginal d/c or bleeding.  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  She said it did not taste right.       Home Medications Prior to Admission medications   Medication Sig Start Date End Date Taking? Authorizing Provider  ondansetron (ZOFRAN-ODT) 4 MG disintegrating tablet Take 1 tablet (4 mg total) by mouth every 8 (eight) hours as needed. 04/25/22  Yes Isla Pence, MD  Prenatal Vit-Fe Fumarate-FA (PRENATAL VITAMIN PO) Take 1 tablet by mouth daily.   Yes [provider]  metroNIDAZOLE (FLAGYL) 500 MG tablet Take 1 tablet (500 mg total) by mouth 2 (two) times daily. Patient not taking: Reported on 04/17/2022 04/01/22   Myrtis Ser, CNM      Allergies    Patient has no known allergies.    Review of Systems   Review of Systems  Gastrointestinal:  Positive for abdominal pain, diarrhea, nausea and vomiting.  All other systems reviewed and are negative.   Physical Exam Updated Vital Signs BP 113/76   Pulse 100   Temp 98.7 F (37.1 C) (Oral)   Resp 20   Ht 5\' 6"  (1.676 m)   Wt 96.2 kg   LMP 11/29/2021 (Approximate)   SpO2 99%   BMI 34.22 kg/m  Physical Exam Vitals and nursing note reviewed. Exam conducted with a chaperone present.  Constitutional:      Appearance: She is well-developed.  HENT:     Head: Normocephalic and atraumatic.     Mouth/Throat:     Mouth: Mucous membranes are dry.     Pharynx: Oropharynx is clear.  Eyes:     Extraocular Movements:  Extraocular movements intact.     Pupils: Pupils are equal, round, and reactive to light.  Cardiovascular:     Rate and Rhythm: Normal rate and regular rhythm.     Heart sounds: Normal heart sounds.  Pulmonary:     Effort: Pulmonary effort is normal.     Breath sounds: Normal breath sounds.  Abdominal:     General: Abdomen is flat. Bowel sounds are normal.     Palpations: Abdomen is soft.     Comments: Gravid uterus  Genitourinary:    Exam position: Lithotomy position.     Vagina: Normal.     Cervix: Normal.     Adnexa: Right adnexa normal and left adnexa normal.     Comments: Gravid uterus Skin:    General: Skin is warm.     Capillary Refill: Capillary refill takes less than 2 seconds.  Neurological:     General: No focal deficit present.     Mental Status: She is alert and oriented to person, place, and time.  Psychiatric:        Mood and Affect: Mood normal.        Behavior: Behavior normal.     ED Results / Procedures / Treatments   Labs (all labs  ordered are listed, but only abnormal results are displayed) Labs Reviewed  RESP PANEL BY RT-PCR (RSV, FLU A&B, COVID)  RVPGX2 - Abnormal; Notable for the following components:      Result Value   SARS Coronavirus 2 by RT PCR POSITIVE (*)    All other components within normal limits  CBC WITH DIFFERENTIAL/PLATELET - Abnormal; Notable for the following components:   WBC 11.5 (*)    Hemoglobin 11.6 (*)    HCT 35.5 (*)    Neutro Abs 10.2 (*)    Lymphs Abs 0.6 (*)    Abs Immature Granulocytes 0.10 (*)    All other components within normal limits  COMPREHENSIVE METABOLIC PANEL - Abnormal; Notable for the following components:   Sodium 132 (*)    Potassium 3.4 (*)    CO2 21 (*)    BUN 5 (*)    Calcium 8.4 (*)    Albumin 3.2 (*)    All other components within normal limits  URINALYSIS, ROUTINE W REFLEX MICROSCOPIC - Abnormal; Notable for the following components:   APPearance HAZY (*)    Glucose, UA 50 (*)    Ketones,  ur 80 (*)    Protein, ur 100 (*)    All other components within normal limits  LIPASE, BLOOD    EKG None  Radiology No results found.  Procedures Procedures    Medications Ordered in ED Medications  sodium chloride 0.9 % bolus 1,000 mL (0 mLs Intravenous Stopped 04/25/22 1014)  ondansetron (ZOFRAN) injection 4 mg (4 mg Intravenous Given 04/25/22 0827)  acetaminophen (TYLENOL) tablet 1,000 mg (1,000 mg Oral Given 04/25/22 1014)    ED Course/ Medical Decision Making/ A&P                           Medical Decision Making Amount and/or Complexity of Data Reviewed Labs: ordered.  Risk OTC drugs. Prescription drug management.   This patient presents to the ED for concern of abd pain, this involves an extensive number of treatment options, and is a complaint that carries with it a high risk of complications and morbidity.  The differential diagnosis includes pre-term labor, infection, virus   Co morbidities that complicate the patient evaluation  pregnancy   Additional history obtained:  Additional history obtained from epic chart review    Lab Tests:  I Ordered, and personally interpreted labs.  The pertinent results include:  cbc with wbc elevated at 11.5, hgb low at 11.6 (both chronic); urine with glucose, ketones, and protein; covid +; flu/rsv neg    Cardiac Monitoring:  The patient was maintained on a cardiac monitor.  I personally viewed and interpreted the cardiac monitored which showed an underlying rhythm of: nsr   Medicines ordered and prescription drug management:  I ordered medication including IVFs and zofran  for dehydration and nausea  Reevaluation of the patient after these medicines showed that the patient improved I have reviewed the patients home medicines and have made adjustments as needed  Critical Interventions:  toco   Consultations Obtained:  I requested consultation with the obgyn (Dr. Berton Lan),  and discussed lab and imaging  findings as well as pertinent plan - she recommends: pelvic exam.  If her cervix is nl, she is cleared from an ob standpoint.   Problem List / ED Course:  [redacted] weeks pregnant:  pt placed on a tocodynamometer and tracings are reassuring. Pelvic ok. Covid-19:  this is likely the cause of sx.  Pt is not hypoxic and is now tolerating po fluids.  She is stable for d/c.  Return if worse.    Reevaluation:  After the interventions noted above, I reevaluated the patient and found that they have :improved   Social Determinants of Health:  Lives at home   Dispostion:  After consideration of the diagnostic results and the patients response to treatment, I feel that the patent would benefit from discharge with outpatient f/u.    Angelica Ramirez was evaluated in Emergency Department on 04/25/2022 for the symptoms described in the history of present illness. She was evaluated in the context of the global COVID-19 pandemic, which necessitated consideration that the patient might be at risk for infection with the SARS-CoV-2 virus that causes COVID-19. Institutional protocols and algorithms that pertain to the evaluation of patients at risk for COVID-19 are in a state of rapid change based on information released by regulatory bodies including the CDC and federal and state organizations. These policies and algorithms were followed during the patient's care in the ED.         Final Clinical Impression(s) / ED Diagnoses Final diagnoses:  Dehydration  [redacted] weeks gestation of pregnancy  COVID-19    Rx / DC Orders ED Discharge Orders          Ordered    ondansetron (ZOFRAN-ODT) 4 MG disintegrating tablet  Every 8 hours PRN        04/25/22 1021              Isla Pence, MD 04/25/22 1030

## 2022-04-25 NOTE — Discharge Instructions (Addendum)
You can take tylenol for pain.  It is ok to take Imodium if you have severe diarrhea.  However, don't take it for more than 24 hours.

## 2022-05-10 DIAGNOSIS — O35BXX1 Maternal care for other (suspected) fetal abnormality and damage, fetal cardiac anomalies, fetus 1: Secondary | ICD-10-CM | POA: Diagnosis not present

## 2022-05-10 DIAGNOSIS — Z3A24 24 weeks gestation of pregnancy: Secondary | ICD-10-CM | POA: Diagnosis not present

## 2022-05-10 DIAGNOSIS — Z3683 Encounter for fetal screening for congenital cardiac abnormalities: Secondary | ICD-10-CM | POA: Diagnosis not present

## 2022-05-15 ENCOUNTER — Ambulatory Visit (INDEPENDENT_AMBULATORY_CARE_PROVIDER_SITE_OTHER): Payer: Commercial Managed Care - PPO | Admitting: Obstetrics & Gynecology

## 2022-05-15 ENCOUNTER — Encounter: Payer: Self-pay | Admitting: Obstetrics & Gynecology

## 2022-05-15 VITALS — BP 122/75 | HR 98 | Wt 217.0 lb

## 2022-05-15 DIAGNOSIS — Z3A25 25 weeks gestation of pregnancy: Secondary | ICD-10-CM

## 2022-05-15 DIAGNOSIS — Z3480 Encounter for supervision of other normal pregnancy, unspecified trimester: Secondary | ICD-10-CM

## 2022-05-15 DIAGNOSIS — Z3482 Encounter for supervision of other normal pregnancy, second trimester: Secondary | ICD-10-CM

## 2022-05-15 DIAGNOSIS — O359XX0 Maternal care for (suspected) fetal abnormality and damage, unspecified, not applicable or unspecified: Secondary | ICD-10-CM

## 2022-05-15 NOTE — Progress Notes (Signed)
   LOW-RISK PREGNANCY VISIT Patient name: Angelica Ramirez MRN [id number]  Date of birth: June 22, 1995 Chief Complaint:   Routine Prenatal Visit  History of Present Illness:   Angelica Ramirez is a 27 y.o. G9P1001 female at [redacted]w[redacted]d with an Estimated Date of Delivery: 08/26/22 being seen today for ongoing management of a low-risk pregnancy.     02/15/2022    2:19 PM 11/22/2016   10:05 AM  Depression screen PHQ 2/9  Decreased Interest 0 2  Down, Depressed, Hopeless 0 1  PHQ - 2 Score 0 3  Altered sleeping 1 2  Tired, decreased energy 1 2  Change in appetite 0 3  Feeling bad or failure about yourself  0 0  Trouble concentrating 0 0  Moving slowly or fidgety/restless 0 0  Suicidal thoughts 0 0  PHQ-9 Score 2 10    Today she reports no complaints. Contractions: Not present. Vag. Bleeding: None.  Movement: Present. denies leaking of fluid. Review of Systems:   Pertinent items are noted in HPI Denies abnormal vaginal discharge w/ itching/odor/irritation, headaches, visual changes, shortness of breath, chest pain, abdominal pain, severe nausea/vomiting, or problems with urination or bowel movements unless otherwise stated above. Pertinent History Reviewed:  Reviewed past medical,surgical, social, obstetrical and family history.  Reviewed problem list, medications and allergies. Physical Assessment:   Vitals:   05/15/22 0937  BP: 122/75  Pulse: 98  Weight: 217 lb (98.4 kg)  Body mass index is 35.02 kg/m.        Physical Examination:   General appearance: Well appearing, and in no distress  Mental status: Alert, oriented to person, place, and time  Skin: Warm & dry  Cardiovascular: Normal heart rate noted  Respiratory: Normal respiratory effort, no distress  Abdomen: Soft, gravid, nontender  Pelvic: Cervical exam deferred         Extremities:    Fetal Status: Fetal Heart Rate (bpm): 152 Fundal Height: 25 cm Movement: Present    Chaperone: n/a    No results found for this or any  previous visit (from the past 24 hour(s)).  Assessment & Plan:  1) Low-risk pregnancy G2P1001 at [redacted]w[redacted]d with an Estimated Date of Delivery: 08/26/22   2) Right sided aortic arch, ECHO confirms, benign condition,    Meds: No orders of the defined types were placed in this encounter.  Labs/procedures today: none  Plan:  Continue routine obstetrical care  Next visit: prefers in person    Reviewed: Preterm labor symptoms and general obstetric precautions including but not limited to vaginal bleeding, contractions, leaking of fluid and fetal movement were reviewed in detail with the patient.  All questions were answered. Has home bp cuff. Rx faxed to . Check bp weekly, let us know if >140/90.   Follow-up: Return in about 3 weeks (around 06/05/2022) for PN2, LROB.  No orders of the defined types were placed in this encounter.   Florian Buff, MD 05/15/2022 10:04 AM

## 2022-06-05 ENCOUNTER — Other Ambulatory Visit: Payer: Commercial Managed Care - PPO

## 2022-06-05 ENCOUNTER — Ambulatory Visit (INDEPENDENT_AMBULATORY_CARE_PROVIDER_SITE_OTHER): Payer: Commercial Managed Care - PPO | Admitting: Women's Health

## 2022-06-05 ENCOUNTER — Encounter: Payer: Self-pay | Admitting: Women's Health

## 2022-06-05 VITALS — BP 119/77 | HR 95 | Wt 221.0 lb

## 2022-06-05 DIAGNOSIS — Z3A28 28 weeks gestation of pregnancy: Secondary | ICD-10-CM | POA: Diagnosis not present

## 2022-06-05 DIAGNOSIS — Z131 Encounter for screening for diabetes mellitus: Secondary | ICD-10-CM

## 2022-06-05 DIAGNOSIS — Z3480 Encounter for supervision of other normal pregnancy, unspecified trimester: Secondary | ICD-10-CM | POA: Diagnosis not present

## 2022-06-05 DIAGNOSIS — Z3483 Encounter for supervision of other normal pregnancy, third trimester: Secondary | ICD-10-CM

## 2022-06-05 DIAGNOSIS — O359XX0 Maternal care for (suspected) fetal abnormality and damage, unspecified, not applicable or unspecified: Secondary | ICD-10-CM

## 2022-06-05 NOTE — Progress Notes (Signed)
LOW-RISK PREGNANCY VISIT Patient name: Angelica Ramirez MRN [id number]  Date of birth: 11/17/1995 Chief Complaint:   Routine Prenatal Visit  History of Present Illness:   Angelica Ramirez is a 27 y.o. G84P1001 female at 36w2dwith an Estimated Date of Delivery: 08/26/22 being seen today for ongoing management of a low-risk pregnancy.   Today she reports no complaints. Contractions: Not present. Vag. Bleeding: None.  Movement: Present. denies leaking of fluid.     02/15/2022    2:19 PM 11/22/2016   10:05 AM  Depression screen PHQ 2/9  Decreased Interest 0 2  Down, Depressed, Hopeless 0 1  PHQ - 2 Score 0 3  Altered sleeping 1 2  Tired, decreased energy 1 2  Change in appetite 0 3  Feeling bad or failure about yourself  0 0  Trouble concentrating 0 0  Moving slowly or fidgety/restless 0 0  Suicidal thoughts 0 0  PHQ-9 Score 2 10        02/15/2022    2:19 PM  GAD 7 : Generalized Anxiety Score  Nervous, Anxious, on Edge 0  Control/stop worrying 0  Worry too much - different things 0  Trouble relaxing 0  Restless 0  Easily annoyed or irritable 0  Afraid - awful might happen 0  Total GAD 7 Score 0      Review of Systems:   Pertinent items are noted in HPI Denies abnormal vaginal discharge w/ itching/odor/irritation, headaches, visual changes, shortness of breath, chest pain, abdominal pain, severe nausea/vomiting, or problems with urination or bowel movements unless otherwise stated above. Pertinent History Reviewed:  Reviewed past medical,surgical, social, obstetrical and family history.  Reviewed problem list, medications and allergies. Physical Assessment:   Vitals:   06/05/22 0943  BP: 119/77  Pulse: 95  Weight: 221 lb (100.2 kg)  Body mass index is 35.67 kg/m.        Physical Examination:   General appearance: Well appearing, and in no distress  Mental status: Alert, oriented to person, place, and time  Skin: Warm & dry  Cardiovascular: Normal heart rate  noted  Respiratory: Normal respiratory effort, no distress  Abdomen: Soft, gravid, nontender  Pelvic: Cervical exam deferred         Extremities:    Fetal Status: Fetal Heart Rate (bpm): 152 Fundal Height: 29 cm Movement: Present    Chaperone: N/A   No results found for this or any previous visit (from the past 24 hour(s)).  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 [facility] pt [facility] @ 0800. Discussed w/ Dr. EuElonda Huskycontinue as low-risk, no negative impact on pregnancy   Meds: No orders of the defined types were placed in this encounter.  Labs/procedures today: declined flu shot and declined tdap  Plan:  Continue routine obstetrical care  Next visit: prefers in person    Reviewed: Preterm labor symptoms and general obstetric precautions including but not limited to vaginal bleeding, contractions, leaking of fluid and fetal movement were reviewed in detail with the patient.  All questions were answered. Does have home bp cuff. Office bp cuff given: not applicable. Check  bp weekly, let us know if consistently >140 and/or >90.  Follow-up: Return in about 2 weeks (around 06/19/2022) for [place], [place], in person.  Future Appointments  Date Time Provider [place]  06/19/2022  9:50 AM Roma Schanz, CNM [facility]    No orders of the defined types were placed in this encounter.  [place], [facility] 06/05/2022 10:39 AM

## 2022-06-05 NOTE — Patient Instructions (Signed)
Angelica Ramirez, thank you for choosing our office today! We appreciate the opportunity to meet your healthcare needs. You may receive a short survey by mail, e-mail, or through [employer]. If you are happy with your care we would appreciate if you could take just a few minutes to complete the survey questions. We read all of your comments and take your feedback very seriously. Thank you again for choosing our office.  Center for [employer] Team at [place] [facility] ([place] [address]) Entrance C, located off of [address] parking   CLASSES: Go to [email or web address] to register for classes (childbirth, breastfeeding, waterbirth, infant CPR, daddy bootcamp, etc.)  Call the office [phone]) or go to [facility] if: You begin to have strong, frequent contractions Your water breaks.  Sometimes it is a big gush of fluid, sometimes it is just a trickle that keeps getting your panties wet or running down your legs You have vaginal bleeding.  It is normal to have a small amount of spotting if your cervix was checked.  You don't feel your baby moving like normal.  If you don't, get you something to eat and drink and lay down and focus on feeling your baby move.   If your baby is still not moving like normal, you should call the office or go to [facility].  Call the office [phone]) or go to [facility] hospital for these signs of pre-eclampsia: Severe headache that does not go away with Tylenol Visual changes- seeing spots, double, blurred vision Pain under your right breast or upper abdomen that does not go away with Tums or heartburn medicine Nausea and/or vomiting Severe swelling in your hands, feet, and face   Tdap Vaccine It is recommended that you get the Tdap vaccine during the third trimester of EACH pregnancy to help protect your baby from getting pertussis (whooping cough) 27-36 weeks is the BEST time to do  this so that you can pass the protection on to your baby. During pregnancy is better than after pregnancy, but if you are unable to get it during pregnancy it will be offered at the hospital.  You can get this vaccine with [place], at the health department, your family doctor, or some local pharmacies Everyone who will be around your baby should also be up-to-date on their vaccines before the baby comes. Adults (who are not pregnant) only need 1 dose of Tdap during adulthood.   [facility] Pediatricians/Family Doctors [facility]): [address] [place], [facility]: [address] [place], [phone]                [facility]): [place], [phone] (call to ask if accepting patients) [facility] Department: [address], [place], [place] Pediatricians/Family Doctors [facility]): [place]. [place], [address], [facility]: [address] [place], [place] [facility]: [place], [facility]): [phone] [facility]: [address], [place]: [address] [facility]: [phone], [phone]  Home Blood Pressure Monitoring for Patients   Your provider has recommended that you check your  blood pressure (BP) at least once a week at home. If you do not have a blood pressure cuff at home, one will be provided for you. Contact your provider if you have not received your monitor within 1 week.   Helpful Tips for Accurate Home Blood Pressure Checks  Don't smoke, exercise, or drink caffeine 30 minutes before checking your BP Use the restroom before checking your BP (a full bladder can raise your  pressure) Relax in a comfortable upright chair Feet on the ground Left arm resting comfortably on a flat surface at the level of your heart Legs uncrossed Back supported Sit quietly and don't talk Place the cuff on your bare arm Adjust snuggly, so that only two fingertips can fit between your skin and the top of the cuff Check 2 readings separated by at least one minute Keep a log of your BP readings For a visual, please reference this diagram: http://ccnc.care/bpdiagram  Provider Name: [facility]     Phone: [phone]  Zone 1: ALL CLEAR  Continue to monitor your symptoms:  BP reading is less than 140 (top number) or less than 90 (bottom number)  No right upper stomach pain No headaches or seeing spots No feeling nauseated or throwing up No swelling in face and hands  Zone 2: CAUTION Call your doctor's office for any of the following:  BP reading is greater than 140 (top number) or greater than 90 (bottom number)  Stomach pain under your ribs in the middle or right side Headaches or seeing spots Feeling nauseated or throwing up Swelling in face and hands  Zone 3: EMERGENCY  Seek immediate medical care if you have any of the following:  BP reading is greater than160 (top number) or greater than 110 (bottom number) Severe headaches not improving with Tylenol Serious difficulty catching your breath Any worsening symptoms from Zone 2   Third Trimester of Pregnancy The third trimester is from week 29 through week 42, months 7 through 9. The third trimester is a time when the fetus is growing rapidly. At the end of the ninth month, the fetus is about 20 inches in length and weighs 6-10 pounds.  BODY CHANGES Your body goes through many changes during pregnancy. The changes vary from woman to woman.  Your weight will continue to increase. You can expect to gain 25-35 pounds (11-16 kg) by the end of the pregnancy. You may begin to get stretch marks on your hips, abdomen,  and breasts. You may urinate more often because the fetus is moving lower into your pelvis and pressing on your bladder. You may develop or continue to have heartburn as a result of your pregnancy. You may develop constipation because certain hormones are causing the muscles that push waste through your intestines to slow down. You may develop hemorrhoids or swollen, bulging veins (varicose veins). You may have pelvic pain because of the weight gain and pregnancy hormones relaxing your joints between the bones in your pelvis. Backaches may result from overexertion of the muscles supporting your posture. You may have changes in your hair. These can include thickening of your hair, rapid growth, and changes in texture. Some women also have hair loss during or after pregnancy, or hair that feels dry or thin. Your hair will most likely return to normal after your baby is born. Your breasts will continue to grow and be tender. A yellow discharge may leak from your breasts called colostrum. Your belly button may stick out. You may   feel short of breath because of your expanding uterus. You may notice the fetus "dropping," or moving lower in your abdomen. You may have a bloody mucus discharge. This usually occurs a few days to a week before labor begins. Your cervix becomes thin and soft (effaced) near your due date. WHAT TO EXPECT AT YOUR PRENATAL EXAMS  You will have prenatal exams every 2 weeks until week 36. Then, you will have weekly prenatal exams. During a routine prenatal visit: You will be weighed to make sure you and the fetus are growing normally. Your blood pressure is taken. Your abdomen will be measured to track your baby's growth. The fetal heartbeat will be listened to. Any test results from the previous visit will be discussed. You may have a cervical check near your due date to see if you have effaced. At around 36 weeks, your caregiver will check your cervix. At the same time, your  caregiver will also perform a test on the secretions of the vaginal tissue. This test is to determine if a type of bacteria, Group B streptococcus, is present. Your caregiver will explain this further. Your caregiver may ask you: What your birth plan is. How you are feeling. If you are feeling the baby move. If you have had any abnormal symptoms, such as leaking fluid, bleeding, severe headaches, or abdominal cramping. If you have any questions. Other tests or screenings that may be performed during your third trimester include: Blood tests that check for low iron levels (anemia). Fetal testing to check the health, activity level, and growth of the fetus. Testing is done if you have certain medical conditions or if there are problems during the pregnancy. FALSE LABOR You may feel small, irregular contractions that eventually go away. These are called Braxton Hicks contractions, or false labor. Contractions may last for hours, days, or even weeks before true labor sets in. If contractions come at regular intervals, intensify, or become painful, it is best to be seen by your caregiver.  SIGNS OF LABOR  Menstrual-like cramps. Contractions that are 5 minutes apart or less. Contractions that start on the top of the uterus and spread down to the lower abdomen and back. A sense of increased pelvic pressure or back pain. A watery or bloody mucus discharge that comes from the vagina. If you have any of these signs before the 37th week of pregnancy, call your caregiver right away. You need to go to the hospital to get checked immediately. HOME CARE INSTRUCTIONS  Avoid all smoking, herbs, alcohol, and unprescribed drugs. These chemicals affect the formation and growth of the baby. Follow your caregiver's instructions regarding medicine use. There are medicines that are either safe or unsafe to take during pregnancy. Exercise only as directed by your caregiver. Experiencing uterine cramps is a good sign to  stop exercising. Continue to eat regular, healthy meals. Wear a good support bra for breast tenderness. Do not use hot tubs, steam rooms, or saunas. Wear your seat belt at all times when driving. Avoid raw meat, uncooked cheese, cat litter boxes, and soil used by cats. These carry germs that can cause birth defects in the baby. Take your prenatal vitamins. Try taking a stool softener (if your caregiver approves) if you develop constipation. Eat more high-fiber foods, such as fresh vegetables or fruit and whole grains. Drink plenty of fluids to keep your urine clear or pale yellow. Take warm sitz baths to soothe any pain or discomfort caused by hemorrhoids. Use hemorrhoid cream if   your caregiver approves. If you develop varicose veins, wear support hose. Elevate your feet for 15 minutes, 3-4 times a day. Limit salt in your diet. Avoid heavy lifting, wear low heal shoes, and practice good posture. Rest a lot with your legs elevated if you have leg cramps or low back pain. Visit your dentist if you have not gone during your pregnancy. Use a soft toothbrush to brush your teeth and be gentle when you floss. A sexual relationship may be continued unless your caregiver directs you otherwise. Do not travel far distances unless it is absolutely necessary and only with the approval of your caregiver. Take prenatal classes to understand, practice, and ask questions about the labor and delivery. Make a trial run to the hospital. Pack your hospital bag. Prepare the baby's nursery. Continue to go to all your prenatal visits as directed by your caregiver. SEEK MEDICAL CARE IF: You are unsure if you are in labor or if your water has broken. You have dizziness. You have mild pelvic cramps, pelvic pressure, or nagging pain in your abdominal area. You have persistent nausea, vomiting, or diarrhea. You have a bad smelling vaginal discharge. You have pain with urination. SEEK IMMEDIATE MEDICAL CARE IF:  You  have a fever. You are leaking fluid from your vagina. You have spotting or bleeding from your vagina. You have severe abdominal cramping or pain. You have rapid weight loss or gain. You have shortness of breath with chest pain. You notice sudden or extreme swelling of your face, hands, ankles, feet, or legs. You have not felt your baby move in over an hour. You have severe headaches that do not go away with medicine. You have vision changes. Document Released: 03/27/2001 Document Revised: 04/07/2013 Document Reviewed: 06/03/2012 ExitCare Patient Information 2015 [employer]. This information is not intended to replace advice given to you by your health care provider. Make sure you discuss any questions you have with your health care provider.       

## 2022-06-06 ENCOUNTER — Other Ambulatory Visit: Payer: Self-pay | Admitting: Women's Health

## 2022-06-06 LAB — CBC
Hematocrit: 32.2 % — ABNORMAL LOW (ref 34.0–46.6)
Hemoglobin: 10.3 g/dL — ABNORMAL LOW (ref 11.1–15.9)
MCH: 26.6 pg (ref 26.6–33.0)
MCHC: 32 g/dL (ref 31.5–35.7)
MCV: 83 fL (ref 79–97)
Platelets: 240 10*3/uL (ref 150–450)
RBC: 3.87 x10E6/uL (ref 3.77–5.28)
RDW: 12.2 % (ref 11.7–15.4)
WBC: 11.3 10*3/uL — ABNORMAL HIGH (ref 3.4–10.8)

## 2022-06-06 LAB — ANTIBODY SCREEN: Antibody Screen: NEGATIVE

## 2022-06-06 LAB — GLUCOSE TOLERANCE, 2 HOURS W/ 1HR
Glucose, 1 hour: 148 mg/dL (ref 70–179)
Glucose, 2 hour: 129 mg/dL (ref 70–152)
Glucose, Fasting: 78 mg/dL (ref 70–91)

## 2022-06-06 LAB — RPR: RPR Ser Ql: NONREACTIVE

## 2022-06-06 LAB — HIV ANTIBODY (ROUTINE TESTING W REFLEX): HIV Screen 4th Generation wRfx: NONREACTIVE

## 2022-06-06 MED ORDER — FERROUS SULFATE 325 (65 FE) MG PO TABS: 325.0000 mg | ORAL_TABLET | ORAL | 2 refills | Status: DC

## 2022-06-19 ENCOUNTER — Ambulatory Visit (INDEPENDENT_AMBULATORY_CARE_PROVIDER_SITE_OTHER): Payer: Commercial Managed Care - PPO | Admitting: Women's Health

## 2022-06-19 ENCOUNTER — Encounter: Payer: Self-pay | Admitting: Women's Health

## 2022-06-19 VITALS — BP 109/75 | HR 105 | Wt 226.0 lb

## 2022-06-19 DIAGNOSIS — Z3483 Encounter for supervision of other normal pregnancy, third trimester: Secondary | ICD-10-CM

## 2022-06-19 DIAGNOSIS — O359XX0 Maternal care for (suspected) fetal abnormality and damage, unspecified, not applicable or unspecified: Secondary | ICD-10-CM

## 2022-06-19 DIAGNOSIS — L299 Pruritus, unspecified: Secondary | ICD-10-CM

## 2022-06-19 DIAGNOSIS — Z3480 Encounter for supervision of other normal pregnancy, unspecified trimester: Secondary | ICD-10-CM

## 2022-06-19 DIAGNOSIS — Z3A30 30 weeks gestation of pregnancy: Secondary | ICD-10-CM

## 2022-06-19 NOTE — Patient Instructions (Signed)
Angelica Ramirez, thank you for choosing our office today! We appreciate the opportunity to meet your healthcare needs. You may receive a short survey by mail, e-mail, or through [employer]. If you are happy with your care we would appreciate if you could take just a few minutes to complete the survey questions. We read all of your comments and take your feedback very seriously. Thank you again for choosing our office.  Center for [employer] Team at [place] [facility] ([place], [place] [address]) Entrance C, located off of [address] parking   CLASSES: Go to [email or web address] to register for classes (childbirth, breastfeeding, waterbirth, infant CPR, daddy bootcamp, etc.)  Call the office [phone]) or go to [facility] if: You begin to have strong, frequent contractions Your water breaks.  Sometimes it is a big gush of fluid, sometimes it is just a trickle that keeps getting your panties wet or running down your legs You have vaginal bleeding.  It is normal to have a small amount of spotting if your cervix was checked.  You don't feel your baby moving like normal.  If you don't, get you something to eat and drink and lay down and focus on feeling your baby move.   If your baby is still not moving like normal, you should call the office or go to [facility].  Call the office [phone]) or go to [facility] hospital for these signs of pre-eclampsia: Severe headache that does not go away with Tylenol Visual changes- seeing spots, double, blurred vision Pain under your right breast or upper abdomen that does not go away with Tums or heartburn medicine Nausea and/or vomiting Severe swelling in your hands, feet, and face   Tdap Vaccine It is recommended that you get the Tdap vaccine during the third trimester of EACH pregnancy to help protect your baby from getting pertussis (whooping cough) 27-36 weeks is the BEST time to do  this so that you can pass the protection on to your baby. During pregnancy is better than after pregnancy, but if you are unable to get it during pregnancy it will be offered at the hospital.  You can get this vaccine with [place], at the health department, your family doctor, or some local pharmacies Everyone who will be around your baby should also be up-to-date on their vaccines before the baby comes. Adults (who are not pregnant) only need 1 dose of Tdap during adulthood.   [facility] Pediatricians/Family Doctors [facility]): [address] [place], [facility]: [address] [place], [phone]                [facility]): [place], [phone] (call to ask if accepting patients) [facility] Department: [address], [place], [place] Pediatricians/Family Doctors [facility]): [place]. [place], [address], [place] [facility]: [address] [place], [place] [facility]: [place], [place] [facility]): [phone] [facility]: [address] [place]: [address], [place] [facility]: [phone], [phone]  Home Blood Pressure Monitoring for Patients   Your provider has recommended that you check your  blood pressure (BP) at least once a week at home. If you do not have a blood pressure cuff at home, one will be provided for you. Contact your provider if you have not received your monitor within 1 week.   Helpful Tips for Accurate Home Blood Pressure Checks  Don't smoke, exercise, or drink caffeine 30 minutes before checking your BP Use the restroom before checking your BP (a full bladder can raise your  pressure) Relax in a comfortable upright chair Feet on the ground Left arm resting comfortably on a flat surface at the level of your heart Legs uncrossed Back supported Sit quietly and don't talk Place the cuff on your bare arm Adjust snuggly, so that only two fingertips can fit between your skin and the top of the cuff Check 2 readings separated by at least one minute Keep a log of your BP readings For a visual, please reference this diagram: http://ccnc.care/bpdiagram  Provider Name: [facility]     Phone: [phone]  Zone 1: ALL CLEAR  Continue to monitor your symptoms:  BP reading is less than 140 (top number) or less than 90 (bottom number)  No right upper stomach pain No headaches or seeing spots No feeling nauseated or throwing up No swelling in face and hands  Zone 2: CAUTION Call your doctor's office for any of the following:  BP reading is greater than 140 (top number) or greater than 90 (bottom number)  Stomach pain under your ribs in the middle or right side Headaches or seeing spots Feeling nauseated or throwing up Swelling in face and hands  Zone 3: EMERGENCY  Seek immediate medical care if you have any of the following:  BP reading is greater than160 (top number) or greater than 110 (bottom number) Severe headaches not improving with Tylenol Serious difficulty catching your breath Any worsening symptoms from Zone 2  Preterm Labor and Birth Information  The normal length of a pregnancy is 39-41 weeks. Preterm labor is when labor starts before 37 completed weeks of pregnancy. What are the risk factors for preterm labor? Preterm labor is more likely to occur in women who: Have certain infections during pregnancy such as a bladder infection, sexually transmitted infection, or infection inside the uterus (chorioamnionitis). Have a shorter-than-normal cervix. Have gone into preterm labor before. Have had surgery on their cervix. Are younger than age 17  or older than age 35. Are African American. Are pregnant with twins or multiple babies (multiple gestation). Take street drugs or smoke while pregnant. Do not gain enough weight while pregnant. Became pregnant shortly after having been pregnant. What are the symptoms of preterm labor? Symptoms of preterm labor include: Cramps similar to those that can happen during a menstrual period. The cramps may happen with diarrhea. Pain in the abdomen or lower back. Regular uterine contractions that may feel like tightening of the abdomen. A feeling of increased pressure in the pelvis. Increased watery or bloody mucus discharge from the vagina. Water breaking (ruptured amniotic sac). Why is it important to recognize signs of preterm labor? It is important to recognize signs of preterm labor because babies who are born prematurely may not be fully developed. This can put them at an increased risk for: Long-term (chronic) heart and lung problems. Difficulty immediately after birth with regulating body systems, including blood sugar, body temperature, heart rate, and breathing rate. Bleeding in the brain. Cerebral palsy. Learning difficulties. Death. These risks are highest for babies who are born before 34 weeks   of pregnancy. How is preterm labor treated? Treatment depends on the length of your pregnancy, your condition, and the health of your baby. It may involve: Having a stitch (suture) placed in your cervix to prevent your cervix from opening too early (cerclage). Taking or being given medicines, such as: Hormone medicines. These may be given early in pregnancy to help support the pregnancy. Medicine to stop contractions. Medicines to help mature the baby's lungs. These may be prescribed if the risk of delivery is high. Medicines to prevent your baby from developing cerebral palsy. If the labor happens before 34 weeks of pregnancy, you may need to stay in the hospital. What should I do if I  think I am in preterm labor? If you think that you are going into preterm labor, call your health care provider right away. How can I prevent preterm labor in future pregnancies? To increase your chance of having a full-term pregnancy: Do not use any tobacco products, such as cigarettes, chewing tobacco, and e-cigarettes. If you need help quitting, ask your health care provider. Do not use street drugs or medicines that have not been prescribed to you during your pregnancy. Talk with your health care provider before taking any herbal supplements, even if you have been taking them regularly. Make sure you gain a healthy amount of weight during your pregnancy. Watch for infection. If you think that you might have an infection, get it checked right away. Make sure to tell your health care provider if you have gone into preterm labor before. This information is not intended to replace advice given to you by your health care provider. Make sure you discuss any questions you have with your health care provider. Document Revised: 07/25/2018 Document Reviewed: 08/24/2015 Elsevier Patient Education  2020 [employer]   

## 2022-06-19 NOTE — Progress Notes (Signed)
LOW-RISK PREGNANCY VISIT Patient name: Angelica Ramirez MRN [id number]  Date of birth: 1995/05/10 Chief Complaint:   Routine Prenatal Visit (Ferrous made her sick,instead drinking beet juice. Skin itching)  History of Present Illness:   Angelica Ramirez is a 27 y.o. G72P1001 female at 65w2dwith an Estimated Date of Delivery: 08/26/22 being seen today for ongoing management of a low-risk pregnancy.   Today she reports  itching all over, worse at night, not necessarily on palms/soles. No h/o ICP. .Marland KitchenFe made her n/v, switched to beet juice. Contractions: Not present.  .  Movement: Present. denies leaking of fluid.     02/15/2022    2:19 PM 11/22/2016   10:05 AM  Depression screen PHQ 2/9  Decreased Interest 0 2  Down, Depressed, Hopeless 0 1  PHQ - 2 Score 0 3  Altered sleeping 1 2  Tired, decreased energy 1 2  Change in appetite 0 3  Feeling bad or failure about yourself  0 0  Trouble concentrating 0 0  Moving slowly or fidgety/restless 0 0  Suicidal thoughts 0 0  PHQ-9 Score 2 10        02/15/2022    2:19 PM  GAD 7 : Generalized Anxiety Score  Nervous, Anxious, on Edge 0  Control/stop worrying 0  Worry too much - different things 0  Trouble relaxing 0  Restless 0  Easily annoyed or irritable 0  Afraid - awful might happen 0  Total GAD 7 Score 0      Review of Systems:   Pertinent items are noted in HPI Denies abnormal vaginal discharge w/ itching/odor/irritation, headaches, visual changes, shortness of breath, chest pain, abdominal pain, severe nausea/vomiting, or problems with urination or bowel movements unless otherwise stated above. Pertinent History Reviewed:  Reviewed past medical,surgical, social, obstetrical and family history.  Reviewed problem list, medications and allergies. Physical Assessment:   Vitals:   06/19/22 0945  BP: 109/75  Pulse: (!) 105  Weight: 226 lb (102.5 kg)  Body mass index is 36.48 kg/m.        Physical Examination:   General  appearance: Well appearing, and in no distress  Mental status: Alert, oriented to person, place, and time  Skin: Warm & dry  Cardiovascular: Normal heart rate noted  Respiratory: Normal respiratory effort, no distress  Abdomen: Soft, gravid, nontender  Pelvic: Cervical exam deferred         Extremities: Edema: None  Fetal Status:     Movement: Present    Chaperone: N/A   No results found for this or any previous visit (from the past 24 hour(s)).  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 [facility]. Discussed w/ Dr. EuElonda Huskyreviously, continue as low-risk, no negative impact on pregnancy   3) Generalized itching/worse at night> fasting labs today  4) Mild anemia> hgb was 10.3 at last visit, Fe made her sick, taking beet juice. To increase Fe-rich foods as well   Meds: No orders of the defined types were placed in this encounter.  Labs/procedures today:  Bile acids (pt is fasting) and CMP  Plan:  Continue routine obstetrical care  Next visit: prefers in person    Reviewed: Preterm labor symptoms and general obstetric precautions including but not limited to vaginal bleeding, contractions, leaking of fluid and fetal movement were reviewed in detail with the patient.  All questions were answered. Does have home bp cuff. Office bp cuff given: not applicable. Check bp weekly, let us know if consistently >140 and/or >90.  Follow-up: Return in about 2 weeks (around 07/03/2022) for LROB, MD, in person.  No future appointments.  No orders of the defined types were placed in this encounter.  [place],  [facility] 06/19/2022 10:21 AM

## 2022-06-21 LAB — COMPREHENSIVE METABOLIC PANEL
ALT: 8 IU/L (ref 0–32)
AST: 17 IU/L (ref 0–40)
Albumin/Globulin Ratio: 1.3 (ref 1.2–2.2)
Albumin: 3.7 g/dL — ABNORMAL LOW (ref 4.0–5.0)
Alkaline Phosphatase: 80 IU/L (ref 44–121)
BUN/Creatinine Ratio: 9 (ref 9–23)
BUN: 4 mg/dL — ABNORMAL LOW (ref 6–20)
Bilirubin Total: 0.3 mg/dL (ref 0.0–1.2)
CO2: 20 mmol/L (ref 20–29)
Calcium: 8.8 mg/dL (ref 8.7–10.2)
Chloride: 103 mmol/L (ref 96–106)
Creatinine, Ser: 0.47 mg/dL — ABNORMAL LOW (ref 0.57–1.00)
Globulin, Total: 2.8 g/dL (ref 1.5–4.5)
Glucose: 79 mg/dL (ref 70–99)
Potassium: 4 mmol/L (ref 3.5–5.2)
Sodium: 136 mmol/L (ref 134–144)
Total Protein: 6.5 g/dL (ref 6.0–8.5)
eGFR: 135 mL/min/{1.73_m2} (ref >59)

## 2022-06-21 LAB — BILE ACIDS, TOTAL: Bile Acids Total: 2.1 umol/L (ref 0.0–10.0)

## 2022-07-04 ENCOUNTER — Ambulatory Visit (INDEPENDENT_AMBULATORY_CARE_PROVIDER_SITE_OTHER): Payer: Commercial Managed Care - PPO | Admitting: Obstetrics and Gynecology

## 2022-07-04 ENCOUNTER — Encounter: Payer: Self-pay | Admitting: Obstetrics and Gynecology

## 2022-07-04 VITALS — BP 121/75 | HR 96 | Wt 223.0 lb

## 2022-07-04 DIAGNOSIS — Z3480 Encounter for supervision of other normal pregnancy, unspecified trimester: Secondary | ICD-10-CM

## 2022-07-04 DIAGNOSIS — O359XX0 Maternal care for (suspected) fetal abnormality and damage, unspecified, not applicable or unspecified: Secondary | ICD-10-CM

## 2022-07-04 DIAGNOSIS — Z3A32 32 weeks gestation of pregnancy: Secondary | ICD-10-CM

## 2022-07-04 NOTE — Progress Notes (Signed)
Subjective:  Angelica Ramirez is a 27 y.o. G2P1001 at [redacted]w[redacted]d being seen today for ongoing prenatal care.  She is currently monitored for the following issues for this low-risk pregnancy and has Marijuana use; Encounter for supervision of normal pregnancy, antepartum; and Fetal abnormality in pregnancy, antepartum on their problem list.  Patient reports  general discomforts of pregnancy .  Contractions: Not present. Vag. Bleeding: None.  Movement: Present. Denies leaking of fluid.   The following portions of the patient's history were reviewed and updated as appropriate: allergies, current medications, past family history, past medical history, past social history, past surgical history and problem list. Problem list updated.  Objective:   Vitals:   07/04/22 1018  BP: 121/75  Pulse: 96  Weight: 223 lb (101.2 kg)    Fetal Status:     Movement: Present     General:  Alert, oriented and cooperative. Patient is in no acute distress.  Skin: Skin is warm and dry. No rash noted.   Cardiovascular: Normal heart rate noted  Respiratory: Normal respiratory effort, no problems with respiration noted  Abdomen: Soft, gravid, appropriate for gestational age. Pain/Pressure: Present     Pelvic:  Cervical exam deferred        Extremities: Normal range of motion.  Edema: None  Mental Status: Normal mood and affect. Normal behavior. Normal judgment and thought content.   Urinalysis:      Assessment and Plan:  Pregnancy: G2P1001 at [redacted]w[redacted]d  1. Supervision of other normal pregnancy, antepartum Stable  2. Fetal abnormality during pregnancy, antepartum, single or unspecified fetus F/U ECHO with Peds card next week  Preterm labor symptoms and general obstetric precautions including but not limited to vaginal bleeding, contractions, leaking of fluid and fetal movement were reviewed in detail with the patient. Please refer to After Visit Summary for other counseling recommendations.  Return in about 2 weeks  (around 07/18/2022) for OB visit, face to face, MD only.   Chancy Milroy, MD

## 2022-07-18 ENCOUNTER — Inpatient Hospital Stay (HOSPITAL_COMMUNITY)
Admission: AD | Admit: 2022-07-18 | Discharge: 2022-07-18 | Disposition: A | Discharge status: Home / Self Care | Payer: Commercial Managed Care - PPO | Attending: Obstetrics and Gynecology | Admitting: Obstetrics and Gynecology

## 2022-07-18 ENCOUNTER — Encounter (HOSPITAL_COMMUNITY): Payer: Self-pay | Admitting: Obstetrics and Gynecology

## 2022-07-18 ENCOUNTER — Ambulatory Visit (INDEPENDENT_AMBULATORY_CARE_PROVIDER_SITE_OTHER): Payer: Commercial Managed Care - PPO | Admitting: Obstetrics & Gynecology

## 2022-07-18 VITALS — BP 131/83 | HR 74 | Wt 231.0 lb

## 2022-07-18 DIAGNOSIS — O4703 False labor before 37 completed weeks of gestation, third trimester: Secondary | ICD-10-CM | POA: Diagnosis not present

## 2022-07-18 DIAGNOSIS — R109 Unspecified abdominal pain: Secondary | ICD-10-CM | POA: Insufficient documentation

## 2022-07-18 DIAGNOSIS — Z3480 Encounter for supervision of other normal pregnancy, unspecified trimester: Secondary | ICD-10-CM

## 2022-07-18 DIAGNOSIS — O368130 Decreased fetal movements, third trimester, not applicable or unspecified: Secondary | ICD-10-CM

## 2022-07-18 DIAGNOSIS — Z3A34 34 weeks gestation of pregnancy: Secondary | ICD-10-CM

## 2022-07-18 DIAGNOSIS — O359XX0 Maternal care for (suspected) fetal abnormality and damage, unspecified, not applicable or unspecified: Secondary | ICD-10-CM

## 2022-07-18 DIAGNOSIS — O4700 False labor before 37 completed weeks of gestation, unspecified trimester: Secondary | ICD-10-CM | POA: Diagnosis present

## 2022-07-18 DIAGNOSIS — O26893 Other specified pregnancy related conditions, third trimester: Secondary | ICD-10-CM | POA: Diagnosis not present

## 2022-07-18 DIAGNOSIS — R82998 Other abnormal findings in urine: Secondary | ICD-10-CM

## 2022-07-18 LAB — URINALYSIS, ROUTINE W REFLEX MICROSCOPIC
Bacteria, UA: NONE SEEN
Bilirubin Urine: NEGATIVE
Glucose, UA: 50 mg/dL — AB
Ketones, ur: NEGATIVE mg/dL
Nitrite: NEGATIVE
Protein, ur: NEGATIVE mg/dL
Specific Gravity, Urine: 1.012 (ref 1.005–1.030)
pH: 6 (ref 5.0–8.0)

## 2022-07-18 MED ORDER — NIFEDIPINE 10 MG PO CAPS
10.0000 mg | ORAL_CAPSULE | ORAL | Status: DC | PRN
  Filled 2022-07-18: qty 1

## 2022-07-18 MED ORDER — CEFADROXIL 500 MG PO CAPS: 500.0000 mg | ORAL_CAPSULE | Freq: Two times a day (BID) | ORAL | 0 refills | Status: AC

## 2022-07-18 NOTE — MAU Provider Note (Signed)
Chief Complaint:  Decreased Fetal Movement   Event Date/Time   First Provider Initiated Contact with Patient 07/18/22 [id number]     HPI: Angelica Ramirez is a 27 y.o. G2P1001 at 65w3dwho presents to maternity admissions reporting decreased fetal movement and cramping since about 2200.  States was on her feel a lot last night working on the nursery.  Cramping is better now but still does not feel as much movement.  Does feel more since being on monitor. . She denies LOF, vaginal bleeding, vaginal itching/burning, n/v, diarrhea, constipation or fever/chills.    Abdominal Pain This is a new problem. The current episode started today. The onset quality is gradual. The problem occurs intermittently. The problem has been rapidly improving. The pain is at a severity of 0/10. The patient is experiencing no pain. The quality of the pain is cramping. Pertinent negatives include no constipation, diarrhea, dysuria, fever, frequency, nausea or vomiting. Nothing aggravates the pain. The pain is relieved by Nothing. She has tried nothing for the symptoms.   RN Note: FHR- 140 Pt says last movement was 10 pm last night  Cramping started at 10 pm- lasted all night  Less cramping now- pain - 0.  [facility] Denies HSV            Last sex- last Sat   Past Medical History: Past Medical History:  Diagnosis Date   Medical history non-contributory     Past obstetric history: OB History  Gravida Para Term Preterm AB Living  2 1 1  0 0 1  SAB IAB Ectopic Multiple Live Births  0 0 0 0 1    # Outcome Date GA Lbr Len/2nd Weight Sex Delivery Anes PTL Lv  2 Current           1 Term 06/19/17 [redacted]w[redacted]d 12:45 / 07:08 3405 g F Vag-Spont EPI N LIV     Birth Comments: HUGS 069   [id number]    Past Surgical History: Past Surgical History:  Procedure Laterality Date   NO PAST SURGERIES      Family History: Family History  Problem Relation Age of Onset   Diabetes Maternal Grandmother    Diabetes Paternal Grandmother      Social History: Social History   Tobacco Use   Smoking status: Never   Smokeless tobacco: Never  Vaping Use   Vaping Use: Never used  Substance Use Topics   Alcohol use: Not Currently    Comment: occasional   Drug use: Not Currently    Types: Marijuana    Comment: last used June 2018    Allergies: No Known Allergies  Meds:  Medications Prior to Admission  Medication Sig Dispense Refill Last Dose   Prenatal Vit-Fe Fumarate-FA (PRENATAL VITAMIN PO) Take 1 tablet by mouth daily.   Past Week   ferrous sulfate 325 (65 FE) MG tablet Take 1 tablet (325 mg total) by mouth every other day. 45 tablet 2 06/11/2022   ondansetron (ZOFRAN-ODT) 4 MG disintegrating tablet Take 1 tablet (4 mg total) by mouth every 8 (eight) hours as needed. (Patient not taking: Reported on 06/05/2022) 20 tablet 0     I have reviewed patient's Past Medical Hx, Surgical Hx, Family Hx, Social Hx, medications and allergies.   ROS:  Review of Systems  Constitutional:  Negative for fever.  Gastrointestinal:  Positive for abdominal pain. Negative for constipation, diarrhea, nausea and vomiting.  Genitourinary:  Negative for dysuria and frequency.   Other systems negative  Physical Exam  Patient Vitals for the past 24 hrs:  BP Temp Temp src Pulse Resp Height Weight  07/18/22 0546 118/78 99 F (37.2 C) Oral (!) 101 16 5\' 6"  (1.676 m) 104.6 kg   Constitutional: Well-developed, well-nourished female in no acute distress.  Cardiovascular: normal rate  Respiratory: normal effort GI: Abd soft, non-tender, gravid appropriate for gestational age.   No rebound or guarding. MS: Extremities nontender, no edema, normal ROM Neurologic: Alert and oriented x 4.  GU: Neg CVAT.  PELVIC EXAM:   Dilation: Fingertip Effacement (%): 20 Exam by:: Hansel Feinstein, CNM.  FHT:  Baseline 140 , moderate variability, accelerations present, no decelerations Contractions: Uterine irritability   Labs: Results for orders placed  or performed during the hospital encounter of 07/18/22 (from the past 24 hour(s))  Urinalysis, Routine w reflex microscopic -Urine, Clean Catch     Status: Abnormal   Collection Time: 07/18/22  6:46 AM  Result Value Ref Range   Color, Urine YELLOW YELLOW   APPearance HAZY (A) CLEAR   Specific Gravity, Urine 1.012 1.005 - 1.030   pH 6.0 5.0 - 8.0   Glucose, UA 50 (A) NEGATIVE mg/dL   Hgb urine dipstick SMALL (A) NEGATIVE   Bilirubin Urine NEGATIVE NEGATIVE   Ketones, ur NEGATIVE NEGATIVE mg/dL   Protein, ur NEGATIVE NEGATIVE mg/dL   Nitrite NEGATIVE NEGATIVE   Leukocytes,Ua LARGE (A) NEGATIVE   RBC / HPF 0-5 0 - 5 RBC/hpf   WBC, UA 6-10 0 - 5 WBC/hpf   Bacteria, UA NONE SEEN NONE SEEN   Squamous Epithelial / HPF 11-20 0 - 5 /HPF   Mucus PRESENT    s O/Positive/-- (11/02 1509)  Imaging:  No results found.  MAU Course/MDM: I have reviewed the triage vital signs and the nursing notes.   Pertinent labs & imaging results that were available during my care of the patient were reviewed by me and considered in my medical decision making (see chart for details).      I have reviewed her medical records including past results, notes and treatments.   I have ordered labs and reviewed results. UA shows possible UTI, sent to culture NST reviewed, reactive with audible fetal movement  Treatments in MAU included EFM, recommended Procardia to stop irregular contractions, patient declined med Has appt this am at office, advised to have them recheck her.    Assessment: Single IUP at [redacted]w[redacted]d Uterine cramping, resolved Decreased fetal movement, improved Leukocytes in urine, suspect UTI  Plan: Discharge home Urine to culture Rx Duracef for possible UTI Preterm Labor precautions and fetal kick counts Follow up in Office for prenatal visits and recheck Encouraged to return if she develops worsening of symptoms, increase in pain, fever, or other concerning symptoms.   Pt stable at time of  discharge.  Hansel Feinstein CNM, MSN Certified Nurse-Midwife 07/18/2022 6:19 AM

## 2022-07-18 NOTE — Progress Notes (Signed)
LOW-RISK PREGNANCY VISIT Patient name: Angelica Ramirez MRN [id number]  Date of birth: May 14, 1995 Chief Complaint:   Routine Prenatal Visit  History of Present Illness:   Angelica Ramirez is a 26 y.o. G41P1001 female at [redacted]w[redacted]d with an Estimated Date of Delivery: 08/26/22 being seen today for ongoing management of a low-risk pregnancy.      02/15/2022    2:19 PM 11/22/2016   10:05 AM  Depression screen PHQ 2/9  Decreased Interest 0 2  Down, Depressed, Hopeless 0 1  PHQ - 2 Score 0 3  Altered sleeping 1 2  Tired, decreased energy 1 2  Change in appetite 0 3  Feeling bad or failure about yourself  0 0  Trouble concentrating 0 0  Moving slowly or fidgety/restless 0 0  Suicidal thoughts 0 0  PHQ-9 Score 2 10    Today she reports  that she was recently seen in MAU this am due to pelvic pressure/discomfort and decreased movement.  She is now reporting better movement.  NST reactive and not dilated on exam. . Contractions: Not present.  .  Movement: Present. denies leaking of fluid. Review of Systems:   Pertinent items are noted in HPI Denies abnormal vaginal discharge w/ itching/odor/irritation, headaches, visual changes, shortness of breath, chest pain, severe nausea/vomiting, or problems with urination or bowel movements unless otherwise stated above. Pertinent History Reviewed:  Reviewed past medical,surgical, social, obstetrical and family history.  Reviewed problem list, medications and allergies.  Physical Assessment:   Vitals:   07/18/22 1045  BP: 131/83  Pulse: 74  Weight: 231 lb (104.8 kg)  Body mass index is 37.28 kg/m.        Physical Examination:   General appearance: Well appearing, and in no distress  Mental status: Alert, oriented to person, place, and time  Skin: Warm & dry  Respiratory: Normal respiratory effort, no distress  Abdomen: Soft, gravid, nontender  Pelvic: Cervical exam deferred         Extremities: Edema: None  Psych:  mood and affect  appropriate  Fetal Status: Fetal Heart Rate (bpm): 140 Fundal Height: 34 cm Movement: Present    Chaperone: n/a    Results for orders placed or performed during the hospital encounter of 07/18/22 (from the past 24 hour(s))  Urinalysis, Routine w reflex microscopic -Urine, Clean Catch   Collection Time: 07/18/22  6:46 AM  Result Value Ref Range   Color, Urine YELLOW YELLOW   APPearance HAZY (A) CLEAR   Specific Gravity, Urine 1.012 1.005 - 1.030   pH 6.0 5.0 - 8.0   Glucose, UA 50 (A) NEGATIVE mg/dL   Hgb urine dipstick SMALL (A) NEGATIVE   Bilirubin Urine NEGATIVE NEGATIVE   Ketones, ur NEGATIVE NEGATIVE mg/dL   Protein, ur NEGATIVE NEGATIVE mg/dL   Nitrite NEGATIVE NEGATIVE   Leukocytes,Ua LARGE (A) NEGATIVE   RBC / HPF 0-5 0 - 5 RBC/hpf   WBC, UA 6-10 0 - 5 WBC/hpf   Bacteria, UA NONE SEEN NONE SEEN   Squamous Epithelial / HPF 11-20 0 - 5 /HPF   Mucus PRESENT      Assessment & Plan:  1) Low-risk pregnancy G2P1001 at [redacted]w[redacted]d with an Estimated Date of Delivery: 08/26/22   -right sided aortic arch- seen at [facility] 3/25- recommendation listed []  fetal ECHO after delivery to confirm diagnosis   Meds: No orders of the defined types were placed in this encounter.  Labs/procedures today: none  Plan:  Continue routine obstetrical care  Next  visit: prefers in person    Reviewed: Preterm labor symptoms and general obstetric precautions including but not limited to vaginal bleeding, contractions, leaking of fluid and fetal movement were reviewed in detail with the patient.  All questions were answered. Pt has home bp cuff. Check bp weekly, let us know if >140/90.   Follow-up: Return in about 2 weeks (around 08/01/2022) for [place] visit.   Janyth Pupa, DO Attending [place], [facility] for [employer], [place]

## 2022-07-18 NOTE — MAU Note (Signed)
FHR- 140 Pt says last movement was 10 pm last night  Cramping started at 10 pm- lasted all night  Less cramping now- pain - 0.  [facility] Denies HSV Last sex- last Sat

## 2022-07-19 ENCOUNTER — Telehealth: Payer: Self-pay | Admitting: Advanced Practice Midwife

## 2022-07-19 LAB — CULTURE, OB URINE: Culture: NO GROWTH

## 2022-07-19 NOTE — Telephone Encounter (Signed)
See Phone Note. 

## 2022-08-01 ENCOUNTER — Ambulatory Visit (INDEPENDENT_AMBULATORY_CARE_PROVIDER_SITE_OTHER): Payer: Commercial Managed Care - PPO | Admitting: Advanced Practice Midwife

## 2022-08-01 ENCOUNTER — Other Ambulatory Visit (HOSPITAL_COMMUNITY)
Admission: RE | Admit: 2022-08-01 | Discharge: 2022-08-01 | Disposition: A | Discharge status: Home / Self Care | Payer: Commercial Managed Care - PPO | Source: Ambulatory Visit | Attending: Advanced Practice Midwife | Admitting: Advanced Practice Midwife

## 2022-08-01 ENCOUNTER — Encounter: Payer: Commercial Managed Care - PPO | Admitting: Advanced Practice Midwife

## 2022-08-01 ENCOUNTER — Encounter: Payer: Self-pay | Admitting: Advanced Practice Midwife

## 2022-08-01 VITALS — BP 123/81 | HR 110 | Wt 233.4 lb

## 2022-08-01 DIAGNOSIS — Z3A36 36 weeks gestation of pregnancy: Secondary | ICD-10-CM | POA: Diagnosis not present

## 2022-08-01 DIAGNOSIS — Z3483 Encounter for supervision of other normal pregnancy, third trimester: Secondary | ICD-10-CM | POA: Insufficient documentation

## 2022-08-01 DIAGNOSIS — Z3480 Encounter for supervision of other normal pregnancy, unspecified trimester: Secondary | ICD-10-CM

## 2022-08-01 NOTE — Progress Notes (Signed)
   LOW-RISK PREGNANCY VISIT Patient name: Angelica Ramirez MRN [id number]  Date of birth: 22-Jun-1995 Chief Complaint:   Routine Prenatal Visit (culture)  History of Present Illness:   Angelica Ramirez is a 27 y.o. G72P1001 female at [redacted]w[redacted]d with an Estimated Date of Delivery: 08/26/22 being seen today for ongoing management of a low-risk pregnancy.  Today she reports occasional contractions. Contractions: Irregular.  .  Movement: Present. denies leaking of fluid. Review of Systems:   Pertinent items are noted in HPI Denies abnormal vaginal discharge w/ itching/odor/irritation, headaches, visual changes, shortness of breath, chest pain, abdominal pain, severe nausea/vomiting, or problems with urination or bowel movements unless otherwise stated above. Pertinent History Reviewed:  Reviewed past medical,surgical, social, obstetrical and family history.  Reviewed problem list, medications and allergies. Physical Assessment:   Vitals:   08/01/22 1611  BP: 123/81  Pulse: (!) 110  Weight: 233 lb 6.4 oz (105.9 kg)  Body mass index is 37.67 kg/m.        Physical Examination:   General appearance: Well appearing, and in no distress  Mental status: Alert, oriented to person, place, and time  Skin: Warm & dry  Cardiovascular: Normal heart rate noted  Respiratory: Normal respiratory effort, no distress  Abdomen: Soft, gravid, nontender  Pelvic: Cervical exam performed  Dilation: Fingertip Effacement (%): Thick Station: -3  Extremities: Edema: None  Fetal Status: Fetal Heart Rate (bpm): 134 Fundal Height: 36 cm Movement: Present    No results found for this or any previous visit (from the past 24 hour(s)).  Assessment & Plan:  1) Low-risk pregnancy G2P1001 at [redacted]w[redacted]d with an Estimated Date of Delivery: 08/26/22   2) Fetal R sided aortic arch, needs fetal echo as a neonate   Meds: No orders of the defined types were placed in this encounter.  Labs/procedures today: SVE, GBS/cultures  Plan:   Continue routine obstetrical care   Reviewed: Term labor symptoms and general obstetric precautions including but not limited to vaginal bleeding, contractions, leaking of fluid and fetal movement were reviewed in detail with the patient.  All questions were answered. Has home bp cuff. Check bp weekly, let us know if >140/90.   Follow-up: Return for As scheduled.  Orders Placed This Encounter  Procedures   Culture, beta strep (group b only)   Arabella Merles [facility] 08/01/2022 4:45 PM

## 2022-08-02 DIAGNOSIS — Z3493 Encounter for supervision of normal pregnancy, unspecified, third trimester: Secondary | ICD-10-CM | POA: Diagnosis not present

## 2022-08-02 DIAGNOSIS — Z3A36 36 weeks gestation of pregnancy: Secondary | ICD-10-CM | POA: Diagnosis not present

## 2022-08-03 LAB — CERVICOVAGINAL ANCILLARY ONLY
Chlamydia: NEGATIVE
Comment: NEGATIVE
Comment: NORMAL
Neisseria Gonorrhea: NEGATIVE

## 2022-08-05 ENCOUNTER — Encounter: Payer: Self-pay | Admitting: Advanced Practice Midwife

## 2022-08-05 DIAGNOSIS — B951 Streptococcus, group B, as the cause of diseases classified elsewhere: Secondary | ICD-10-CM | POA: Insufficient documentation

## 2022-08-05 LAB — CULTURE, BETA STREP (GROUP B ONLY): Strep Gp B Culture: POSITIVE — AB

## 2022-08-07 ENCOUNTER — Other Ambulatory Visit: Payer: Self-pay

## 2022-08-07 ENCOUNTER — Encounter (HOSPITAL_COMMUNITY): Payer: Self-pay | Admitting: Obstetrics and Gynecology

## 2022-08-07 ENCOUNTER — Inpatient Hospital Stay (HOSPITAL_COMMUNITY)
Admission: AD | Admit: 2022-08-07 | Discharge: 2022-08-08 | DRG: 807 | Disposition: A | Discharge status: Home / Self Care | Payer: Commercial Managed Care - PPO | Attending: Family Medicine | Admitting: Family Medicine

## 2022-08-07 ENCOUNTER — Inpatient Hospital Stay (HOSPITAL_COMMUNITY): Payer: Commercial Managed Care - PPO | Admitting: Anesthesiology

## 2022-08-07 DIAGNOSIS — Z3A37 37 weeks gestation of pregnancy: Secondary | ICD-10-CM | POA: Diagnosis not present

## 2022-08-07 DIAGNOSIS — O9902 Anemia complicating childbirth: Secondary | ICD-10-CM | POA: Diagnosis present

## 2022-08-07 DIAGNOSIS — Z148 Genetic carrier of other disease: Secondary | ICD-10-CM

## 2022-08-07 DIAGNOSIS — O99820 Streptococcus B carrier state complicating pregnancy: Secondary | ICD-10-CM | POA: Diagnosis not present

## 2022-08-07 DIAGNOSIS — O99824 Streptococcus B carrier state complicating childbirth: Secondary | ICD-10-CM | POA: Diagnosis not present

## 2022-08-07 DIAGNOSIS — B951 Streptococcus, group B, as the cause of diseases classified elsewhere: Secondary | ICD-10-CM | POA: Diagnosis present

## 2022-08-07 DIAGNOSIS — O358XX0 Maternal care for other (suspected) fetal abnormality and damage, not applicable or unspecified: Secondary | ICD-10-CM | POA: Diagnosis present

## 2022-08-07 DIAGNOSIS — O26893 Other specified pregnancy related conditions, third trimester: Secondary | ICD-10-CM | POA: Diagnosis present

## 2022-08-07 DIAGNOSIS — D649 Anemia, unspecified: Secondary | ICD-10-CM | POA: Diagnosis not present

## 2022-08-07 DIAGNOSIS — O359XX0 Maternal care for (suspected) fetal abnormality and damage, unspecified, not applicable or unspecified: Secondary | ICD-10-CM | POA: Diagnosis present

## 2022-08-07 DIAGNOSIS — Z3490 Encounter for supervision of normal pregnancy, unspecified, unspecified trimester: Secondary | ICD-10-CM

## 2022-08-07 LAB — CBC
HCT: 32.9 % — ABNORMAL LOW (ref 36.0–46.0)
Hemoglobin: 10.7 g/dL — ABNORMAL LOW (ref 12.0–15.0)
MCH: 24.5 pg — ABNORMAL LOW (ref 26.0–34.0)
MCHC: 32.5 g/dL (ref 30.0–36.0)
MCV: 75.3 fL — ABNORMAL LOW (ref 80.0–100.0)
Platelets: 249 10*3/uL (ref 150–400)
RBC: 4.37 MIL/uL (ref 3.87–5.11)
RDW: 13.9 % (ref 11.5–15.5)
WBC: 13.6 10*3/uL — ABNORMAL HIGH (ref 4.0–10.5)
nRBC: 0 % (ref 0.0–0.2)

## 2022-08-07 LAB — RPR: RPR Ser Ql: NONREACTIVE

## 2022-08-07 LAB — TYPE AND SCREEN
ABO/RH(D): O POS
Antibody Screen: NEGATIVE

## 2022-08-07 MED ORDER — SODIUM CHLORIDE 0.9% FLUSH: 3.0000 mL | INTRAVENOUS | Status: DC | PRN

## 2022-08-07 MED ORDER — LIDOCAINE HCL (PF) 1 % IJ SOLN
INTRAMUSCULAR | Status: DC | PRN
  Administered 2022-08-07: 5 mL via EPIDURAL

## 2022-08-07 MED ORDER — ONDANSETRON HCL 4 MG PO TABS: 4.0000 mg | ORAL_TABLET | ORAL | Status: DC | PRN

## 2022-08-07 MED ORDER — FENTANYL CITRATE (PF) 100 MCG/2ML IJ SOLN
50.0000 ug | INTRAMUSCULAR | Status: DC | PRN
  Administered 2022-08-07 (×2): 100 ug via INTRAVENOUS
  Filled 2022-08-07 (×2): qty 2

## 2022-08-07 MED ORDER — ACETAMINOPHEN 325 MG PO TABS: 650.0000 mg | ORAL_TABLET | ORAL | Status: DC | PRN

## 2022-08-07 MED ORDER — OXYTOCIN-SODIUM CHLORIDE 30-0.9 UT/500ML-% IV SOLN
2.5000 [IU]/h | INTRAVENOUS | Status: DC
  Administered 2022-08-07: 2.5 [IU]/h via INTRAVENOUS
  Filled 2022-08-07: qty 500

## 2022-08-07 MED ORDER — OXYTOCIN BOLUS FROM INFUSION
333.0000 mL | Freq: Once | INTRAVENOUS | Status: AC
  Administered 2022-08-07: 333 mL via INTRAVENOUS

## 2022-08-07 MED ORDER — PHENYLEPHRINE 80 MCG/ML (10ML) SYRINGE FOR IV PUSH (FOR BLOOD PRESSURE SUPPORT): 80.0000 ug | PREFILLED_SYRINGE | INTRAVENOUS | Status: DC | PRN

## 2022-08-07 MED ORDER — LIDOCAINE HCL (PF) 1 % IJ SOLN: 30.0000 mL | INTRAMUSCULAR | Status: DC | PRN

## 2022-08-07 MED ORDER — SIMETHICONE 80 MG PO CHEW: 80.0000 mg | CHEWABLE_TABLET | ORAL | Status: DC | PRN

## 2022-08-07 MED ORDER — EPHEDRINE 5 MG/ML INJ: 10.0000 mg | INTRAVENOUS | Status: DC | PRN

## 2022-08-07 MED ORDER — DIBUCAINE (PERIANAL) 1 % EX OINT: 1.0000 | TOPICAL_OINTMENT | CUTANEOUS | Status: DC | PRN

## 2022-08-07 MED ORDER — IBUPROFEN 600 MG PO TABS
600.0000 mg | ORAL_TABLET | Freq: Four times a day (QID) | ORAL | Status: DC
  Administered 2022-08-07 – 2022-08-08 (×5): 600 mg via ORAL
  Filled 2022-08-07 (×5): qty 1

## 2022-08-07 MED ORDER — SODIUM CHLORIDE 0.9% FLUSH
3.0000 mL | Freq: Two times a day (BID) | INTRAVENOUS | Status: DC
  Administered 2022-08-07 – 2022-08-08 (×3): 3 mL via INTRAVENOUS

## 2022-08-07 MED ORDER — DIPHENHYDRAMINE HCL 25 MG PO CAPS: 25.0000 mg | ORAL_CAPSULE | Freq: Four times a day (QID) | ORAL | Status: DC | PRN

## 2022-08-07 MED ORDER — SENNOSIDES-DOCUSATE SODIUM 8.6-50 MG PO TABS
2.0000 | ORAL_TABLET | ORAL | Status: DC
  Administered 2022-08-07: 2 via ORAL
  Filled 2022-08-07: qty 2

## 2022-08-07 MED ORDER — DIPHENHYDRAMINE HCL 50 MG/ML IJ SOLN: 12.5000 mg | INTRAMUSCULAR | Status: DC | PRN

## 2022-08-07 MED ORDER — ONDANSETRON HCL 4 MG/2ML IJ SOLN
4.0000 mg | Freq: Four times a day (QID) | INTRAMUSCULAR | Status: DC | PRN
  Administered 2022-08-07: 4 mg via INTRAVENOUS
  Filled 2022-08-07: qty 2

## 2022-08-07 MED ORDER — COCONUT OIL OIL: 1.0000 | TOPICAL_OIL | Status: DC | PRN

## 2022-08-07 MED ORDER — LACTATED RINGERS IV SOLN: INTRAVENOUS | Status: DC

## 2022-08-07 MED ORDER — SODIUM CHLORIDE 0.9 % IV SOLN
5.0000 10*6.[IU] | Freq: Once | INTRAVENOUS | Status: AC
  Administered 2022-08-07: 5 10*6.[IU] via INTRAVENOUS
  Filled 2022-08-07: qty 5

## 2022-08-07 MED ORDER — OXYCODONE-ACETAMINOPHEN 5-325 MG PO TABS: 1.0000 | ORAL_TABLET | ORAL | Status: DC | PRN

## 2022-08-07 MED ORDER — FENTANYL-BUPIVACAINE-NACL 0.5-0.125-0.9 MG/250ML-% EP SOLN
EPIDURAL | Status: AC
  Filled 2022-08-07: qty 250

## 2022-08-07 MED ORDER — ZOLPIDEM TARTRATE 5 MG PO TABS: 5.0000 mg | ORAL_TABLET | Freq: Every evening | ORAL | Status: DC | PRN

## 2022-08-07 MED ORDER — WITCH HAZEL-GLYCERIN EX PADS
1.0000 | MEDICATED_PAD | CUTANEOUS | Status: DC | PRN
  Administered 2022-08-07: 1 via TOPICAL

## 2022-08-07 MED ORDER — FENTANYL-BUPIVACAINE-NACL 0.5-0.125-0.9 MG/250ML-% EP SOLN
12.0000 mL/h | EPIDURAL | Status: DC | PRN
  Administered 2022-08-07: 12 mL/h via EPIDURAL

## 2022-08-07 MED ORDER — ONDANSETRON HCL 4 MG/2ML IJ SOLN: 4.0000 mg | INTRAMUSCULAR | Status: DC | PRN

## 2022-08-07 MED ORDER — PENICILLIN G POT IN DEXTROSE 60000 UNIT/ML IV SOLN
3.0000 10*6.[IU] | INTRAVENOUS | Status: DC
  Administered 2022-08-07: 3 10*6.[IU] via INTRAVENOUS
  Filled 2022-08-07: qty 50

## 2022-08-07 MED ORDER — SODIUM CHLORIDE 0.9 % IV SOLN: 250.0000 mL | INTRAVENOUS | Status: DC | PRN

## 2022-08-07 MED ORDER — LACTATED RINGERS IV SOLN: 500.0000 mL | Freq: Once | INTRAVENOUS | Status: DC

## 2022-08-07 MED ORDER — LIDOCAINE-EPINEPHRINE (PF) 1.5 %-1:200000 IJ SOLN
INTRAMUSCULAR | Status: DC | PRN
  Administered 2022-08-07: 5 mL via EPIDURAL

## 2022-08-07 MED ORDER — OXYCODONE-ACETAMINOPHEN 5-325 MG PO TABS: 2.0000 | ORAL_TABLET | ORAL | Status: DC | PRN

## 2022-08-07 MED ORDER — LACTATED RINGERS IV SOLN: 500.0000 mL | INTRAVENOUS | Status: DC | PRN

## 2022-08-07 MED ORDER — SOD CITRATE-CITRIC ACID 500-334 MG/5ML PO SOLN: 30.0000 mL | ORAL | Status: DC | PRN

## 2022-08-07 MED ORDER — PRENATAL MULTIVITAMIN CH
1.0000 | ORAL_TABLET | Freq: Every day | ORAL | Status: DC
  Administered 2022-08-08: 1 via ORAL
  Filled 2022-08-07: qty 1

## 2022-08-07 MED ORDER — BENZOCAINE-MENTHOL 20-0.5 % EX AERO
1.0000 | INHALATION_SPRAY | CUTANEOUS | Status: DC | PRN
  Administered 2022-08-07: 1 via TOPICAL
  Filled 2022-08-07: qty 56

## 2022-08-07 NOTE — Discharge Summary (Signed)
Postpartum Discharge Summary  Patient Name: Angelica Ramirez DOB: 1996-01-04 MRN: [id number]  Date of admission: 08/07/2022 Delivery date:08/07/2022  Delivering provider: Myrtie Hawk  Date of discharge: 08/08/2022  Admitting diagnosis: Pregnant [Z34.90] Intrauterine pregnancy: [redacted]w[redacted]d     Secondary diagnosis:  Principal Problem:   Vaginal delivery Active Problems:   Encounter for supervision of normal pregnancy, antepartum   Fetal abnormality in pregnancy, antepartum   Positive testing for group B Streptococcus   Pregnant  Additional problems: n/a    Discharge diagnosis: Term Pregnancy Delivered                                              Post partum procedures: n/a Augmentation: AROM Complications: None  Hospital course: Onset of Labor With Vaginal Delivery      27 y.o. yo G2P1001 at [redacted]w[redacted]d was admitted in Latent Labor on 08/07/2022. Labor course was complicated by none.  Membrane Rupture Time/Date: 12:10 PM ,08/07/2022   Delivery Method:Vaginal, Spontaneous  Episiotomy: None  Lacerations:  None  Patient had a postpartum course complicated by anemia, PO IRON given since IV iron couldn't be given in the setting of IV infiltration.  She is ambulating, tolerating a regular diet, passing flatus, and urinating well. Patient is discharged home in stable condition on 08/08/22.  Newborn Data: Birth date:08/07/2022  Birth time:12:51 PM  Gender:Female  Living status:  Apgars:7 ,9  Weight:3180 g   Magnesium Sulfate received: No BMZ received: No Rhophylac:N/A MMR:N/A T-DaP: declined Flu: No Transfusion:No  Physical exam  Vitals:   08/07/22 2204 08/08/22 0219 08/08/22 0604 08/08/22 1458  BP: 112/63 113/71 116/72 127/81  Pulse: 93 84 86 94  Resp: Temp: 98.4 F (36.9 C) 98.1 F (36.7 C) 97.9 F (36.6 C) 98.6 F (37 C)  TempSrc: Oral Oral  Oral  SpO2: 100%  100% 99%  Weight:      Height:       General: alert, cooperative, and no distress Lochia:  appropriate Uterine Fundus: firm Incision: N/A DVT Evaluation: No evidence of DVT seen on physical exam. Labs: Lab Results  Component Value Date   WBC 16.0 (H) 08/08/2022   HGB 8.7 (L) 08/08/2022   HCT 27.8 (L) 08/08/2022   MCV 77.0 (L) 08/08/2022   PLT 210 08/08/2022      Latest Ref Rng & Units 06/19/2022   10:41 AM  CMP  Glucose 70 - 99 mg/dL 79   BUN 6 - 20 mg/dL 4   Creatinine 2.13 - 0.86 mg/dL 5.78   Sodium 469 - 629 mmol/L 136   Potassium 3.5 - 5.2 mmol/L 4.0   Chloride 96 - 106 mmol/L 103   CO2 20 - 29 mmol/L 20   Calcium 8.7 - 10.2 mg/dL 8.8   Total Protein 6.0 - 8.5 g/dL 6.5   Total Bilirubin 0.0 - 1.2 mg/dL 0.3   Alkaline Phos 44 - 121 IU/L 80   AST 0 - 40 IU/L 17   ALT 0 - 32 IU/L 8    Edinburgh Score:    08/07/2022   11:24 PM  Edinburgh Postnatal Depression Scale Screening Tool  I have been able to laugh and see the funny side of things. 0  I have looked forward with enjoyment to things. 0  I have blamed myself unnecessarily when things went wrong. 0  I have been  anxious or worried for no good reason. 0  I have felt scared or panicky for no good reason. 0  Things have been getting on top of me. 0  I have been so unhappy that I have had difficulty sleeping. 0  I have felt sad or miserable. 0  I have been so unhappy that I have been crying. 0  The thought of harming myself has occurred to me. 0  Edinburgh Postnatal Depression Scale Total 0     After visit meds:  Allergies as of 08/08/2022   No Known Allergies      Medication List     STOP taking these medications    ferrous sulfate 325 (65 FE) MG tablet   PRENATAL VITAMIN PO       TAKE these medications    acetaminophen 325 MG tablet Commonly known as: Tylenol Take 2 tablets (650 mg total) by mouth every 4 (four) hours as needed (for pain scale < 4).   benzocaine-Menthol 20-0.5 % Aero Commonly known as: DERMOPLAST Apply 1 Application topically as needed for irritation (perineal  discomfort).   ibuprofen 600 MG tablet Commonly known as: ADVIL Take 1 tablet (600 mg total) by mouth every 6 (six) hours.   senna-docusate 8.6-50 MG tablet Commonly known as: Senokot-S Take 2 tablets by mouth daily.   Slynd 4 MG Tabs Generic drug: Drospirenone Take 1 tablet (4 mg total) by mouth daily.   witch hazel-glycerin pad Commonly known as: TUCKS Apply 1 Application topically as needed for hemorrhoids.         Discharge home in stable condition Infant Feeding: Bottle and Breast Infant Disposition:home with mother Discharge instruction: per After Visit Summary and Postpartum booklet. Activity: Advance as tolerated. Pelvic rest for 6 weeks.  Diet: routine diet Future Appointments: Future Appointments  Date Time Provider Department Center  09/12/2022  1:50 PM Arabella Merles, CNM [facility]   Follow up Visit: Message sent to [facility] 4/23  Please schedule this patient for a In person postpartum visit in 6 weeks with the following provider: Any provider. Additional Postpartum F/U: n/a   Low risk pregnancy complicated by:  n/a Delivery mode:  Vaginal, Spontaneous  Anticipated Birth Control:  POPs   08/08/2022 Myrtie Hawk, DO

## 2022-08-07 NOTE — MAU Note (Signed)
.  Angelica Ramirez is a 27 y.o. at [redacted]w[redacted]d here in MAU reporting:   Contractions every: 2-3 minutes since 0300 Onset of ctx: Today Pain score: 8/10  ROM: Possible ROM pt reports small leak of clear watery fluid mixed with blood  Vaginal Bleeding: Bloody show Last SVE: fingtertip Weds   Epidural: Planning  Fetal Movement: Reports positive FM FHT:137 via External  Vitals:   08/07/22 0603  BP: 131/80  Resp: 18  Temp: 98.4 F (36.9 C)  SpO2: 97%       OB Office: Faculty GBS: Positive HSV: Denies hx of HSV Lab orders placed from triage: MAU Labor Eval

## 2022-08-07 NOTE — Anesthesia Procedure Notes (Signed)
Epidural Patient location during procedure: OB Start time: 08/07/2022 9:25 AM End time: 08/07/2022 9:35 AM  Staffing Anesthesiologist: Leonides Grills, MD Performed: anesthesiologist   Preanesthetic Checklist Completed: patient identified, IV checked, site marked, risks and benefits discussed, monitors and equipment checked, pre-op evaluation and timeout performed  Epidural Patient position: sitting Prep: DuraPrep Patient monitoring: heart rate, cardiac monitor, continuous pulse ox and blood pressure Approach: midline Location: L4-L5 Injection technique: LOR saline  Needle:  Needle type: Tuohy  Needle gauge: 17 G Needle length: 9 cm Needle insertion depth: 8 cm Catheter type: closed end flexible Catheter size: 19 Gauge Catheter at skin depth: 13 cm Test dose: negative and 1.5% lidocaine with Epi 1:200 K  Assessment Events: blood not aspirated, no cerebrospinal fluid, injection not painful, no injection resistance and negative IV test  Additional Notes Informed consent obtained prior to proceeding including risk of failure, 1% risk of PDPH, risk of minor discomfort and bruising. Discussed alternatives to epidural analgesia and patient desires to proceed.  Timeout performed pre-procedure verifying patient name, procedure, and platelet count.  Patient tolerated procedure well. Reason for block:procedure for pain

## 2022-08-07 NOTE — Anesthesia Postprocedure Evaluation (Signed)
Anesthesia Post Note  Patient: Angelica Ramirez  Procedure(s) Performed: AN AD HOC LABOR EPIDURAL     Patient location during evaluation: Mother Baby Anesthesia Type: Epidural Level of consciousness: awake and alert Pain management: pain level controlled Vital Signs Assessment: post-procedure vital signs reviewed and stable Respiratory status: spontaneous breathing, nonlabored ventilation and respiratory function stable Cardiovascular status: stable Postop Assessment: no headache, no backache and epidural receding Anesthetic complications: no   No notable events documented.  Last Vitals:  Vitals:   08/07/22 1410 08/07/22 1430  BP: 113/87 134/75  Pulse: 95 (!) 102  Resp:    Temp:  36.8 C  SpO2:  100%    Last Pain:  Vitals:   08/07/22 1430  TempSrc: Oral  PainSc: 0-No pain   Pain Goal:                Epidural/Spinal Function Cutaneous sensation: Able to Wiggle Toes (08/07/22 1430), Patient able to flex knees: Yes (Right side remains numb) (08/07/22 1430), Patient able to lift hips off bed: Yes (08/07/22 1430), Back pain beyond tenderness at insertion site: No (08/07/22 1430), Progressively worsening motor and/or sensory loss: No (08/07/22 1430), Bowel and/or bladder incontinence post epidural: No (08/07/22 1430)  Deangleo Passage

## 2022-08-07 NOTE — Anesthesia Preprocedure Evaluation (Signed)
Anesthesia Evaluation  Patient identified by MRN, date of birth, ID band Patient awake    Reviewed: Allergy & Precautions, H&P , NPO status , Patient's Chart, lab work & pertinent test results  History of Anesthesia Complications Negative for: history of anesthetic complications  Airway Mallampati: II  TM Distance: >3 FB Neck ROM: full    Dental no notable dental hx. (+) Teeth Intact   Pulmonary neg pulmonary ROS   Pulmonary exam normal breath sounds clear to auscultation       Cardiovascular negative cardio ROS Normal cardiovascular exam Rhythm:regular Rate:Normal     Neuro/Psych negative neurological ROS  negative psych ROS   GI/Hepatic negative GI ROS,,,(+)     substance abuse    Endo/Other  negative endocrine ROS    Renal/GU negative Renal ROS  negative genitourinary   Musculoskeletal   Abdominal  (+) + obese  Peds  Hematology  (+) Blood dyscrasia, anemia   Anesthesia Other Findings   Reproductive/Obstetrics (+) Pregnancy                             Anesthesia Physical Anesthesia Plan  ASA: 2  Anesthesia Plan: Epidural   Post-op Pain Management:    Induction:   PONV Risk Score and Plan:   Airway Management Planned:   Additional Equipment:   Intra-op Plan:   Post-operative Plan:   Informed Consent: I have reviewed the patients History and Physical, chart, labs and discussed the procedure including the risks, benefits and alternatives for the proposed anesthesia with the patient or authorized representative who has indicated his/her understanding and acceptance.       Plan Discussed with:   Anesthesia Plan Comments:        Anesthesia Quick Evaluation

## 2022-08-07 NOTE — H&P (Signed)
OBSTETRIC ADMISSION HISTORY AND PHYSICAL  Angelica Ramirez is a 27 y.o. female G2P1001 with IUP at [redacted]w[redacted]d by ultrasound presenting for SOL. She reports +FMs, no VB, no blurry vision, headaches or peripheral edema, and RUQ pain.  She plans on bottle feeding. She request Phexxi for birth control. She received her [facility] care [facility]   Dating: By 8wk [place] --->  Estimated Date of Delivery: 08/26/22  Sono:    @[redacted]w[redacted]d , CWD, normal anatomy aside from abnormal 3VTV c/w R sided aortic arch, cephalic presentation, 458g, 74% EFW   Prenatal History/Complications:  -GBS+ -Fetal echo 05/10/22 with R sided aortic arch, needs fetal echo after delivery -Alpha thal carrier -ASCUS, neg HPV, repeat in 3y  Past Medical History: Past Medical History:  Diagnosis Date   Medical history non-contributory     Past Surgical History: Past Surgical History:  Procedure Laterality Date   NO PAST SURGERIES      Obstetrical History: OB History     Gravida  2   Para  1   Term  1   Preterm  0   AB  0   Living  1      SAB  0   IAB  0   Ectopic  0   Multiple  0   Live Births  1           Social History Social History   Socioeconomic History   Marital status: Single    Spouse name: Not on file   Number of children: Not on file   Years of education: Not on file   Highest education level: Not on file  Occupational History   Not on file  Tobacco Use   Smoking status: Never   Smokeless tobacco: Never  Vaping Use   Vaping Use: Never used  Substance and Sexual Activity   Alcohol use: Not Currently    Comment: occasional   Drug use: Not Currently    Types: Marijuana    Comment: last used June 2018   Sexual activity: Yes    Birth control/protection: None  Other Topics Concern   Not on file  Social History Narrative   Not on file   Social Determinants of Health   Financial Resource Strain: Low Risk  (02/15/2022)   Overall Financial Resource Strain (CARDIA)     Difficulty of Paying Living Expenses: Not hard at all  Food Insecurity: No Food Insecurity (08/07/2022)   Hunger Vital Sign    Worried About Running Out of Food in the Last Year: Never true    Ran Out of Food in the Last Year: Never true  Transportation Needs: No Transportation Needs (08/07/2022)   PRAPARE - Administrator, Civil Service (Medical): No    Lack of Transportation (Non-Medical): No  Physical Activity: Insufficiently Active (02/15/2022)   Exercise Vital Sign    Days of Exercise per Week: 3 days    Minutes of Exercise per Session: 20 min  Stress: No Stress Concern Present (02/15/2022)   [employer] of Occupational Health - Occupational Stress Questionnaire    Feeling of Stress : Not at all  Social Connections: Socially Integrated (02/15/2022)   Social Connection and Isolation Panel [NHANES]    Frequency of Communication with Friends and Family: More than three times a week    Frequency of Social Gatherings with Friends and Family: Three times a week    Attends Religious Services: 1 to 4 times per year    Active Member  of Clubs or Organizations: Yes    Attends Banker Meetings: 1 to 4 times per year    Marital Status: Living with partner    Family History: Family History  Problem Relation Age of Onset   Diabetes Maternal Grandmother    Diabetes Paternal Grandmother     Allergies: No Known Allergies  Medications Prior to Admission  Medication Sig Dispense Refill Last Dose   Prenatal Vit-Fe Fumarate-FA (PRENATAL VITAMIN PO) Take 1 tablet by mouth daily.   Past Week   ferrous sulfate 325 (65 FE) MG tablet Take 1 tablet (325 mg total) by mouth every other day. (Patient not taking: Reported on 07/18/2022) 45 tablet 2      Review of Systems   All systems reviewed and negative except as stated in HPI  Blood pressure 111/61, pulse (!) 107, temperature 98.4 F (36.9 C), temperature source Oral, resp. rate 18, height  (1.676 m), weight 105.9  kg, last menstrual period 11/29/2021, SpO2 97 %. General appearance: alert and no distress Lungs: clear to auscultation bilaterally Heart: regular rate and rhythm Abdomen: soft, non-tender; bowel sounds normal Extremities: Homans sign is negative, no sign of DVT Fetal monitoringBaseline: 125 bpm, Variability: Good {> 6 bpm), and Decelerations: Variable: mild Uterine activityFrequency: Every 1-2 minutes Dilation: 7.5 Effacement (%): 90 Station: 0 Exam by:: Mosetta Pigeon, RN   Prenatal labs: ABO, Rh: --/--/O POS (04/23 4098) Antibody: NEG (04/23 0723) Rubella: 3.10 (11/02 1509) RPR: Non Reactive (02/20 0932)  HBsAg: Negative (11/02 1509)  HIV: Non Reactive (02/20 0932)  GBS: Positive/-- (04/18 1330)  1 hr Glucola nml Genetic screening  + alpha thal Anatomy US R sided aortic arch  Prenatal Transfer Tool  Maternal Diabetes: No Genetic Screening: Abnormal:  Results: Other: + alpha thal Maternal Ultrasounds/Referrals: Normal and Fetal Heart Anomalies Fetal Ultrasounds or other Referrals:  Fetal echo Maternal Substance Abuse:  No Significant Maternal Medications:  None Significant Maternal Lab Results:  Group B Strep positive Number of Prenatal Visits:greater than 3 verified prenatal visits Other Comments:  None  Results for orders placed or performed during the hospital encounter of 08/07/22 (from the past 24 hour(s))  CBC   Collection Time: 08/07/22  7:23 AM  Result Value Ref Range   WBC 13.6 (H) 4.0 - 10.5 K/uL   RBC 4.37 3.87 - 5.11 MIL/uL   Hemoglobin 10.7 (L) 12.0 - 15.0 g/dL   HCT 11.9 (L) 14.7 - 82.9 %   MCV 75.3 (L) 80.0 - 100.0 fL   MCH 24.5 (L) 26.0 - 34.0 pg   MCHC 32.5 30.0 - 36.0 g/dL   RDW 56.2 13.0 - 86.5 %   Platelets 249 150 - 400 K/uL   nRBC 0.0 0.0 - 0.2 %  Type and screen [facility]   Collection Time: 08/07/22  7:23 AM  Result Value Ref Range   ABO/RH(D) O POS    Antibody Screen NEG    Sample Expiration       08/10/2022,2359 Performed at [facility], [address], [place], [place] [address]     Patient Active Problem List   Diagnosis Date Noted   Pregnant 08/07/2022   Positive testing for group B Streptococcus 08/05/2022   Fetal abnormality in pregnancy, antepartum 04/20/2022   Encounter for supervision of normal pregnancy, antepartum 02/14/2022   Marijuana use 12/07/2016    Assessment/Plan:  Angelica Ramirez is a 27 y.o. G2P1001 at [redacted]w[redacted]d here for SOL  #Labor:expectant management #Pain:  Epidural #FWB: CAT 1 #ID:  GBS pos-PCN #MOF: Bottle #MOC:phexxi #Circ:  yes  Myrtie Hawk, DO  08/07/2022, 10:24 AM

## 2022-08-08 ENCOUNTER — Encounter (HOSPITAL_COMMUNITY): Payer: Self-pay | Admitting: Obstetrics and Gynecology

## 2022-08-08 ENCOUNTER — Encounter: Payer: Commercial Managed Care - PPO | Admitting: Advanced Practice Midwife

## 2022-08-08 LAB — CBC
HCT: 27.8 % — ABNORMAL LOW (ref 36.0–46.0)
Hemoglobin: 8.7 g/dL — ABNORMAL LOW (ref 12.0–15.0)
MCH: 24.1 pg — ABNORMAL LOW (ref 26.0–34.0)
MCHC: 31.3 g/dL (ref 30.0–36.0)
MCV: 77 fL — ABNORMAL LOW (ref 80.0–100.0)
Platelets: 210 10*3/uL (ref 150–400)
RBC: 3.61 MIL/uL — ABNORMAL LOW (ref 3.87–5.11)
RDW: 14.2 % (ref 11.5–15.5)
WBC: 16 10*3/uL — ABNORMAL HIGH (ref 4.0–10.5)
nRBC: 0.1 % (ref 0.0–0.2)

## 2022-08-08 MED ORDER — SODIUM CHLORIDE 0.9 % IV SOLN
500.0000 mg | Freq: Once | INTRAVENOUS | Status: DC
  Administered 2022-08-08: 500 mg via INTRAVENOUS
  Filled 2022-08-08: qty 25

## 2022-08-08 MED ORDER — IBUPROFEN 600 MG PO TABS: 600.0000 mg | ORAL_TABLET | Freq: Four times a day (QID) | ORAL | 0 refills | Status: AC

## 2022-08-08 MED ORDER — FERROUS SULFATE 325 (65 FE) MG PO TABS: 325.0000 mg | ORAL_TABLET | ORAL | Status: DC

## 2022-08-08 MED ORDER — SLYND 4 MG PO TABS: 1.0000 | ORAL_TABLET | Freq: Every day | ORAL | 12 refills | Status: AC

## 2022-08-08 MED ORDER — BENZOCAINE-MENTHOL 20-0.5 % EX AERO: 1.0000 | INHALATION_SPRAY | CUTANEOUS | 0 refills | Status: AC | PRN

## 2022-08-08 MED ORDER — WITCH HAZEL-GLYCERIN EX PADS: 1.0000 | MEDICATED_PAD | CUTANEOUS | 12 refills | Status: AC | PRN

## 2022-08-08 MED ORDER — FERROUS SULFATE 325 (65 FE) MG PO TABS
325.0000 mg | ORAL_TABLET | ORAL | Status: DC
  Administered 2022-08-08: 325 mg via ORAL
  Filled 2022-08-08: qty 1

## 2022-08-08 MED ORDER — IRON SUCROSE 500 MG IVPB - SIMPLE MED
500.0000 mg | Freq: Once | INTRAVENOUS | Status: DC
  Filled 2022-08-08: qty 275

## 2022-08-08 MED ORDER — SENNOSIDES-DOCUSATE SODIUM 8.6-50 MG PO TABS: 2.0000 | ORAL_TABLET | ORAL | 0 refills | Status: AC

## 2022-08-08 MED ORDER — ACETAMINOPHEN 325 MG PO TABS: 650.0000 mg | ORAL_TABLET | ORAL | 0 refills | Status: AC | PRN

## 2022-08-08 NOTE — Progress Notes (Signed)
IV Venofer ordered. IV site infiltrated at the start of transfusion. IV site discontinued. Patient declined a restart on IV, requested PO iron.Notified Dr. Torrie Mayers.

## 2022-08-08 NOTE — Progress Notes (Signed)
Post Partum Day 1 Subjective: Eating, drinking, voiding, ambulating well.  +flatus.  Lochia and pain wnl.  Denies dizziness, lightheadedness, or sob. No complaints. Would like to go home later today if baby ok to go after fetal echo  Objective: Blood pressure 116/72, pulse 86, temperature 97.9 F (36.6 C), resp. rate 18, height  (1.676 m), weight 105.9 kg, last menstrual period 11/29/2021, SpO2 100 %.  Physical Exam:  General: alert, cooperative, and no distress Lochia: appropriate Uterine Fundus: firm Incision: n/a DVT Evaluation: No evidence of DVT seen on physical exam. Negative Homan's sign. No cords or calf tenderness. No significant calf/ankle edema.  Recent Labs    08/07/22 0723 08/08/22 0535  HGB 10.7* 8.7*  HCT 32.9* 27.8*    Assessment/Plan: Bottlefeeding, plans POPs for birth control Anemic- asymptomatic, discussed IV Venofer, risks/benefits, would like to proceed, ordered   LOS: 1 day   Cheral Marker, CNM 08/08/2022, 6:55 AM   CNM Circumcision Counseling Progress Note  Patient desires circumcision for her female infant.  Circumcision procedure details discussed, risks and benefits of procedure were also discussed.  These include but are not limited to: Benefits of circumcision in men include reduction in the rates of urinary tract infection (UTI), penile cancer, some sexually transmitted infections, penile inflammatory and retractile disorders, as well as easier hygiene.  Risks include bleeding , infection, injury of glans which may lead to penile deformity or urinary tract issues, unsatisfactory cosmetic appearance and other potential complications related to the procedure.  It was emphasized that this is an elective procedure.  Patient wants to proceed with circumcision; written informed consent will be obtained.  Will have MD do circumcision when infant is cleared for such by peds.  Joellyn Haff, CNM, [facility] 08/08/2022   6:57 AM

## 2022-08-15 ENCOUNTER — Encounter: Payer: Commercial Managed Care - PPO | Admitting: Advanced Practice Midwife

## 2022-08-16 ENCOUNTER — Telehealth (HOSPITAL_COMMUNITY): Payer: Self-pay

## 2022-08-16 ENCOUNTER — Encounter: Payer: Self-pay | Admitting: Women's Health

## 2022-08-16 ENCOUNTER — Encounter: Payer: Self-pay | Admitting: Advanced Practice Midwife

## 2022-08-16 NOTE — Telephone Encounter (Signed)
Patient did not answer phone call. Voicemail left for patient.   Angelica [facility]   08/16/22,1805

## 2022-08-22 ENCOUNTER — Encounter: Payer: Commercial Managed Care - PPO | Admitting: Advanced Practice Midwife

## 2022-09-12 ENCOUNTER — Ambulatory Visit: Payer: Commercial Managed Care - PPO | Admitting: Advanced Practice Midwife

## 2022-12-09 DIAGNOSIS — M25532 Pain in left wrist: Secondary | ICD-10-CM | POA: Diagnosis not present

## 2022-12-09 DIAGNOSIS — E663 Overweight: Secondary | ICD-10-CM | POA: Diagnosis not present

## 2022-12-09 DIAGNOSIS — Z6829 Body mass index (BMI) 29.0-29.9, adult: Secondary | ICD-10-CM | POA: Diagnosis not present

## 2022-12-09 DIAGNOSIS — M25512 Pain in left shoulder: Secondary | ICD-10-CM | POA: Diagnosis not present

## 2023-07-23 ENCOUNTER — Emergency Department (HOSPITAL_BASED_OUTPATIENT_CLINIC_OR_DEPARTMENT_OTHER)
Admission: EM | Admit: 2023-07-23 | Discharge: 2023-07-24 | Disposition: A | Discharge status: Home / Self Care | Attending: Emergency Medicine | Admitting: Emergency Medicine

## 2023-07-23 ENCOUNTER — Other Ambulatory Visit: Payer: Self-pay

## 2023-07-23 DIAGNOSIS — E860 Dehydration: Secondary | ICD-10-CM | POA: Insufficient documentation

## 2023-07-23 DIAGNOSIS — F1090 Alcohol use, unspecified, uncomplicated: Secondary | ICD-10-CM | POA: Diagnosis not present

## 2023-07-23 DIAGNOSIS — R5383 Other fatigue: Secondary | ICD-10-CM | POA: Diagnosis present

## 2023-07-23 DIAGNOSIS — Z789 Other specified health status: Secondary | ICD-10-CM | POA: Insufficient documentation

## 2023-07-23 DIAGNOSIS — R110 Nausea: Secondary | ICD-10-CM | POA: Diagnosis not present

## 2023-07-23 LAB — BASIC METABOLIC PANEL WITH GFR
Anion gap: 16 — ABNORMAL HIGH (ref 5–15)
BUN: 9 mg/dL (ref 6–20)
CO2: 21 mmol/L — ABNORMAL LOW (ref 22–32)
Calcium: 9 mg/dL (ref 8.9–10.3)
Chloride: 106 mmol/L (ref 98–111)
Creatinine, Ser: 0.72 mg/dL (ref 0.44–1.00)
GFR, Estimated: >60 mL/min (ref >60)
Glucose, Bld: 93 mg/dL (ref 70–99)
Potassium: 3.4 mmol/L — ABNORMAL LOW (ref 3.5–5.1)
Sodium: 143 mmol/L (ref 135–145)

## 2023-07-23 LAB — PROTIME-INR
INR: 1 (ref 0.8–1.2)
Prothrombin Time: 13.2 s (ref 11.4–15.2)

## 2023-07-23 LAB — CBC
HCT: 40.5 % (ref 36.0–46.0)
Hemoglobin: 13.5 g/dL (ref 12.0–15.0)
MCH: 28.3 pg (ref 26.0–34.0)
MCHC: 33.3 g/dL (ref 30.0–36.0)
MCV: 84.9 fL (ref 80.0–100.0)
Platelets: 287 10*3/uL (ref 150–400)
RBC: 4.77 MIL/uL (ref 3.87–5.11)
RDW: 13.3 % (ref 11.5–15.5)
WBC: 11.6 10*3/uL — ABNORMAL HIGH (ref 4.0–10.5)
nRBC: 0 % (ref 0.0–0.2)

## 2023-07-23 LAB — CBG MONITORING, ED: Glucose-Capillary: 95 mg/dL (ref 70–99)

## 2023-07-23 MED ORDER — ONDANSETRON HCL 4 MG/2ML IJ SOLN
4.0000 mg | Freq: Once | INTRAMUSCULAR | Status: AC
  Administered 2023-07-24: 4 mg via INTRAVENOUS
  Filled 2023-07-23: qty 2

## 2023-07-23 MED ORDER — SODIUM CHLORIDE 0.9 % IV BOLUS
1000.0000 mL | Freq: Once | INTRAVENOUS | Status: AC
  Administered 2023-07-24: 1000 mL via INTRAVENOUS

## 2023-07-23 NOTE — ED Triage Notes (Addendum)
 Was drinking "big bottle of cheap liquor" started to feel dizzy, shaking, and weak. Usually does "drink like that". Denies use of any other drugs/substances. Denies medical HX and daily meds.

## 2023-07-24 ENCOUNTER — Encounter (HOSPITAL_BASED_OUTPATIENT_CLINIC_OR_DEPARTMENT_OTHER): Payer: Self-pay

## 2023-07-24 LAB — URINALYSIS, ROUTINE W REFLEX MICROSCOPIC
Bilirubin Urine: NEGATIVE
Glucose, UA: NEGATIVE mg/dL
Hgb urine dipstick: NEGATIVE
Ketones, ur: 40 mg/dL — AB
Leukocytes,Ua: NEGATIVE
Nitrite: NEGATIVE
Protein, ur: 30 mg/dL — AB
Specific Gravity, Urine: 1.026 (ref 1.005–1.030)
pH: 5.5 (ref 5.0–8.0)

## 2023-07-24 LAB — HEPATIC FUNCTION PANEL
ALT: 11 U/L (ref 0–44)
AST: 17 U/L (ref 15–41)
Albumin: 4.7 g/dL (ref 3.5–5.0)
Alkaline Phosphatase: 53 U/L (ref 38–126)
Bilirubin, Direct: 0.1 mg/dL (ref 0.0–0.2)
Indirect Bilirubin: 0.4 mg/dL (ref 0.3–0.9)
Total Bilirubin: 0.5 mg/dL (ref 0.0–1.2)
Total Protein: 8 g/dL (ref 6.5–8.1)

## 2023-07-24 LAB — PREGNANCY, URINE: Preg Test, Ur: NEGATIVE

## 2023-07-24 LAB — MAGNESIUM: Magnesium: 1.8 mg/dL (ref 1.7–2.4)

## 2023-07-24 MED ORDER — ONDANSETRON 4 MG PO TBDP: 4.0000 mg | ORAL_TABLET | Freq: Three times a day (TID) | ORAL | 0 refills | Status: AC | PRN

## 2023-07-24 NOTE — Discharge Instructions (Addendum)
 You were evaluated in the Emergency Department and after careful evaluation, we did not find any emergent condition requiring admission or further testing in the hospital.  Your exam/testing today is overall reassuring.  Can use the Zofran for nausea as needed at home over the next day.  We discussed the importance of quitting alcohol completely.  Can use the resources provided for follow-up.  Please return to the Emergency Department if you experience any worsening of your condition.   Thank you for allowing [place] to be a part of your care.

## 2023-07-24 NOTE — ED Provider Notes (Signed)
 [facility] Emergency Department Provider Note MRN:  [id number]  Arrival date & time: 07/24/23     Chief Complaint   Weakness (EtOH)   History of Present Illness   Angelica Ramirez is a 28 y.o. year-old female with no pertinent past medical history presenting to the ED with chief complaint of weakness.  Patient explains that she was out drinking last night until the late morning and now she feels very unwell, malaise, fatigue, weakness, nausea.  She does this pattern of drinking several times a week and acknowledges that it is unhealthy and she has tried quitting in the past several times.  Has been drinking like this for a few years now.  Denies pain, no fever, no trauma, no other complaints.  Review of Systems  A thorough review of systems was obtained and all systems are negative except as noted in the HPI and PMH.   Patient's Health History    Past Medical History:  Diagnosis Date   Medical history non-contributory     Past Surgical History:  Procedure Laterality Date   NO PAST SURGERIES      Family History  Problem Relation Age of Onset   Diabetes Maternal Grandmother    Diabetes Paternal Grandmother     Social History   Socioeconomic History   Marital status: Single    Spouse name: Not on file   Number of children: Not on file   Years of education: Not on file   Highest education level: Not on file  Occupational History   Not on file  Tobacco Use   Smoking status: Never   Smokeless tobacco: Never  Vaping Use   Vaping status: Never Used  Substance and Sexual Activity   Alcohol use: Not Currently    Comment: occasional   Drug use: Not Currently    Types: Marijuana    Comment: last used June 2018   Sexual activity: Yes    Birth control/protection: None  Other Topics Concern   Not on file  Social History Narrative   Not on file   Social Drivers of Health   Financial Resource Strain: Low Risk  (02/15/2022)   Overall Financial  Resource Strain (CARDIA)    Difficulty of Paying Living Expenses: Not hard at all  Food Insecurity: No Food Insecurity (08/07/2022)   Hunger Vital Sign    Worried About Running Out of Food in the Last Year: Never true    Ran Out of Food in the Last Year: Never true  Transportation Needs: No Transportation Needs (08/07/2022)   PRAPARE - Administrator, Civil Service (Medical): No    Lack of Transportation (Non-Medical): No  Physical Activity: Insufficiently Active (02/15/2022)   Exercise Vital Sign    Days of Exercise per Week: 3 days    Minutes of Exercise per Session: 20 min  Stress: No Stress Concern Present (02/15/2022)   [employer] of Occupational Health - Occupational Stress Questionnaire    Feeling of Stress : Not at all  Social Connections: Socially Integrated (02/15/2022)   Social Connection and Isolation Panel [NHANES]    Frequency of Communication with Friends and Family: More than three times a week    Frequency of Social Gatherings with Friends and Family: Three times a week    Attends Religious Services: 1 to 4 times per year    Active Member of Clubs or Organizations: Yes    Attends Banker Meetings: 1 to 4 times per year  Marital Status: Living with partner  Intimate Partner Violence: Not At Risk (08/07/2022)   Humiliation, Afraid, Rape, and Kick questionnaire    Fear of Current or Ex-Partner: No    Emotionally Abused: No    Physically Abused: No    Sexually Abused: No     Physical Exam   Vitals:   07/24/23 0048 07/24/23 0049  BP:    Pulse: 93 93  Resp: 15 16  Temp:    SpO2: 96% 96%    CONSTITUTIONAL: Well-appearing, NAD NEURO/PSYCH:  Alert and oriented x 3, no focal deficits EYES:  eyes equal and reactive ENT/NECK:  no LAD, no JVD CARDIO: Regular rate, well-perfused, normal S1 and S2 PULM:  CTAB no wheezing or rhonchi GI/GU:  non-distended, non-tender MSK/SPINE:  No gross deformities, no edema SKIN:  no rash,  atraumatic   *Additional and/or pertinent findings included in MDM below  Diagnostic and Interventional Summary    EKG Interpretation Date/Time:  Tuesday July 23 2023 21:21:04 EDT Ventricular Rate:  102 PR Interval:  144 QRS Duration:  82 QT Interval:  350 QTC Calculation: 456 R Axis:   127  Text Interpretation: Sinus tachycardia Right axis deviation Pulmonary disease pattern Nonspecific T wave abnormality Abnormal ECG No previous ECGs available Confirmed by Kennis Carina [phone]) on 07/23/2023 11:07:49 PM       Labs Reviewed  BASIC METABOLIC PANEL WITH GFR - Abnormal; Notable for the following components:      Result Value   Potassium 3.4 (*)    CO2 21 (*)    Anion gap 16 (*)    All other components within normal limits  CBC - Abnormal; Notable for the following components:   WBC 11.6 (*)    All other components within normal limits  URINALYSIS, ROUTINE W REFLEX MICROSCOPIC - Abnormal; Notable for the following components:   Ketones, ur 40 (*)    Protein, ur 30 (*)    Bacteria, UA RARE (*)    All other components within normal limits  PREGNANCY, URINE  HEPATIC FUNCTION PANEL  MAGNESIUM  PROTIME-INR  CBG MONITORING, ED  CBG MONITORING, ED    No orders to display    Medications  ondansetron (ZOFRAN) injection 4 mg (4 mg Intravenous Given 07/24/23 0021)  sodium chloride 0.9 % bolus 1,000 mL (1,000 mLs Intravenous New Bag/Given 07/24/23 0021)     Procedures  /  Critical Care Procedures  ED Course and Medical Decision Making  Initial Impression and Ddx Patient has alcohol use disorder and seems to be trying to come to terms with the fact that she needs to stop alcohol completely.  Suspect her current symptoms are related to being hung over, providing fluids, antinausea medication, will obtain screening hepatic function testing, labs, attempt further counseling.  Past medical/surgical history that increases complexity of ED encounter: None  Interpretation of  Diagnostics I personally reviewed the Laboratory Testing and my interpretation is as follows: No significant blood count or electrolyte disturbance.  Reassuring LFTs.    Patient Reassessment and Ultimate Disposition/Management     Patient feeling better, provided further counseling, appropriate for discharge.  Patient management required discussion with the following services or consulting groups:  None  Complexity of Problems Addressed Acute illness or injury that poses threat of life of bodily function  Additional Data Reviewed and Analyzed Further history obtained from: Further history from spouse/family member  Additional Factors Impacting ED Encounter Risk Prescriptions  Elmer Sow. Pilar Plate, MD [facility] Emergency Medicine [facility]  [email or web address]  Final Clinical Impressions(s) / ED Diagnoses     ICD-10-CM   1. Alcohol use disorder  F10.90     2. Drinking binge  Z78.9     3. Dehydration  E86.0     4. Nausea  R11.0       ED Discharge Orders          Ordered    ondansetron (ZOFRAN-ODT) 4 MG disintegrating tablet  Every 8 hours PRN        07/24/23 0131             Discharge Instructions Discussed with and Provided to Patient:     Discharge Instructions      You were evaluated in the Emergency Department and after careful evaluation, we did not find any emergent condition requiring admission or further testing in the hospital.  Your exam/testing today is overall reassuring.  Can use the Zofran for nausea as needed at home over the next day.  We discussed the importance of quitting alcohol completely.  Can use the resources provided for follow-up.  Please return to the Emergency Department if you experience any worsening of your condition.   Thank you for allowing [place] to be a part of your care.       Sabas Sous, MD 07/24/23 [phone]
